# Patient Record
Sex: Male | Born: 1955 | Race: White | Hispanic: No | Marital: Single | Smoking: Former smoker
Health system: Southern US, Community
[De-identification: ages and names within clinical notes are randomized; demographics above are authoritative.]

## PROBLEM LIST (undated history)

## (undated) DIAGNOSIS — J449 Chronic obstructive pulmonary disease, unspecified: Secondary | ICD-10-CM

## (undated) HISTORY — PX: ANKLE FRACTURE SURGERY: SHX122

## (undated) HISTORY — PX: EYE SURGERY: SHX253

---

## 2001-07-20 ENCOUNTER — Encounter: Payer: Self-pay | Admitting: Internal Medicine

## 2001-07-20 ENCOUNTER — Encounter: Admission: RE | Admit: 2001-07-20 | Discharge: 2001-07-20 | Payer: Self-pay | Admitting: Internal Medicine

## 2005-02-07 ENCOUNTER — Emergency Department (HOSPITAL_COMMUNITY): Admission: EM | Admit: 2005-02-07 | Discharge: 2005-02-07 | Payer: Self-pay | Admitting: Emergency Medicine

## 2011-06-04 ENCOUNTER — Encounter (HOSPITAL_COMMUNITY): Payer: Self-pay | Admitting: Internal Medicine

## 2011-06-04 ENCOUNTER — Other Ambulatory Visit: Payer: Self-pay | Admitting: Internal Medicine

## 2011-06-04 ENCOUNTER — Inpatient Hospital Stay (HOSPITAL_COMMUNITY)
Admission: AD | Admit: 2011-06-04 | Discharge: 2011-06-12 | DRG: 190 | Disposition: A | Discharge status: Home / Self Care | Payer: Managed Care, Other (non HMO) | Source: Ambulatory Visit | Attending: Internal Medicine | Admitting: Internal Medicine

## 2011-06-04 DIAGNOSIS — I498 Other specified cardiac arrhythmias: Secondary | ICD-10-CM | POA: Diagnosis present

## 2011-06-04 DIAGNOSIS — J449 Chronic obstructive pulmonary disease, unspecified: Secondary | ICD-10-CM | POA: Diagnosis present

## 2011-06-04 DIAGNOSIS — F17200 Nicotine dependence, unspecified, uncomplicated: Secondary | ICD-10-CM | POA: Diagnosis present

## 2011-06-04 DIAGNOSIS — J9610 Chronic respiratory failure, unspecified whether with hypoxia or hypercapnia: Secondary | ICD-10-CM | POA: Diagnosis present

## 2011-06-04 DIAGNOSIS — J441 Chronic obstructive pulmonary disease with (acute) exacerbation: Secondary | ICD-10-CM

## 2011-06-04 DIAGNOSIS — R0902 Hypoxemia: Secondary | ICD-10-CM | POA: Diagnosis present

## 2011-06-04 DIAGNOSIS — Z79899 Other long term (current) drug therapy: Secondary | ICD-10-CM

## 2011-06-04 DIAGNOSIS — Z23 Encounter for immunization: Secondary | ICD-10-CM

## 2011-06-04 DIAGNOSIS — J9600 Acute respiratory failure, unspecified whether with hypoxia or hypercapnia: Secondary | ICD-10-CM | POA: Diagnosis present

## 2011-06-04 DIAGNOSIS — Z720 Tobacco use: Secondary | ICD-10-CM | POA: Diagnosis present

## 2011-06-04 DIAGNOSIS — IMO0002 Reserved for concepts with insufficient information to code with codable children: Secondary | ICD-10-CM

## 2011-06-04 HISTORY — DX: Chronic obstructive pulmonary disease, unspecified: J44.9

## 2011-06-04 LAB — DIFFERENTIAL
Basophils Absolute: 0 10*3/uL (ref 0.0–0.1)
Basophils Relative: 0 % (ref 0–1)
Eosinophils Absolute: 0 10*3/uL (ref 0.0–0.7)
Eosinophils Relative: 0 % (ref 0–5)
Lymphocytes Relative: 6 % — ABNORMAL LOW (ref 12–46)
Lymphs Abs: 0.7 10*3/uL (ref 0.7–4.0)
Monocytes Absolute: 0.6 10*3/uL (ref 0.1–1.0)
Monocytes Relative: 4 % (ref 3–12)
Neutro Abs: 11.3 10*3/uL — ABNORMAL HIGH (ref 1.7–7.7)
Neutrophils Relative %: 89 % — ABNORMAL HIGH (ref 43–77)

## 2011-06-04 LAB — CBC
HCT: 52 % (ref 39.0–52.0)
Hemoglobin: 18.6 g/dL — ABNORMAL HIGH (ref 13.0–17.0)
MCH: 33.5 pg (ref 26.0–34.0)
MCHC: 35.8 g/dL (ref 30.0–36.0)
MCV: 93.7 fL (ref 78.0–100.0)
Platelets: 183 10*3/uL (ref 150–400)
RBC: 5.55 MIL/uL (ref 4.22–5.81)
RDW: 12.1 % (ref 11.5–15.5)
WBC: 12.6 10*3/uL — ABNORMAL HIGH (ref 4.0–10.5)

## 2011-06-04 LAB — COMPREHENSIVE METABOLIC PANEL
ALT: 29 U/L (ref 0–53)
AST: 31 U/L (ref 0–37)
Albumin: 4.3 g/dL (ref 3.5–5.2)
Alkaline Phosphatase: 107 U/L (ref 39–117)
BUN: 16 mg/dL (ref 6–23)
CO2: 30 mEq/L (ref 19–32)
Calcium: 9.5 mg/dL (ref 8.4–10.5)
Chloride: 98 mEq/L (ref 96–112)
Creatinine, Ser: 0.91 mg/dL (ref 0.50–1.35)
GFR calc Af Amer: >90 mL/min (ref >90)
GFR calc non Af Amer: >90 mL/min (ref >90)
Glucose, Bld: 100 mg/dL — ABNORMAL HIGH (ref 70–99)
Potassium: 4.9 mEq/L (ref 3.5–5.1)
Sodium: 137 mEq/L (ref 135–145)
Total Bilirubin: 0.2 mg/dL — ABNORMAL LOW (ref 0.3–1.2)
Total Protein: 7.5 g/dL (ref 6.0–8.3)

## 2011-06-04 MED ORDER — ALBUTEROL SULFATE (5 MG/ML) 0.5% IN NEBU: 2.5000 mg | INHALATION_SOLUTION | RESPIRATORY_TRACT | Status: DC | PRN

## 2011-06-04 MED ORDER — SODIUM CHLORIDE 0.9 % IJ SOLN
3.0000 mL | Freq: Two times a day (BID) | INTRAMUSCULAR | Status: DC
  Administered 2011-06-04 – 2011-06-05 (×3): 3 mL via INTRAVENOUS
  Administered 2011-06-06: 10:00:00 via INTRAVENOUS
  Administered 2011-06-06 – 2011-06-07 (×3): 3 mL via INTRAVENOUS
  Administered 2011-06-08: 10:00:00 via INTRAVENOUS
  Administered 2011-06-08 – 2011-06-12 (×8): 3 mL via INTRAVENOUS

## 2011-06-04 MED ORDER — SODIUM CHLORIDE 0.9 % IV SOLN: 250.0000 mL | INTRAVENOUS | Status: DC | PRN

## 2011-06-04 MED ORDER — ALBUTEROL SULFATE (5 MG/ML) 0.5% IN NEBU: 2.5000 mg | INHALATION_SOLUTION | Freq: Four times a day (QID) | RESPIRATORY_TRACT | Status: DC

## 2011-06-04 MED ORDER — PROMETHAZINE HCL 12.5 MG PO TABS: 12.5000 mg | ORAL_TABLET | Freq: Four times a day (QID) | ORAL | Status: DC | PRN

## 2011-06-04 MED ORDER — METHYLPREDNISOLONE SODIUM SUCC 125 MG IJ SOLR
60.0000 mg | Freq: Two times a day (BID) | INTRAMUSCULAR | Status: DC
  Administered 2011-06-04 – 2011-06-05 (×2): 60 mg via INTRAVENOUS
  Filled 2011-06-04: qty 2
  Filled 2011-06-04 (×2): qty 0.96
  Filled 2011-06-04: qty 2

## 2011-06-04 MED ORDER — IPRATROPIUM BROMIDE 0.02 % IN SOLN
RESPIRATORY_TRACT | Status: AC
  Administered 2011-06-04: 0.5 mg via RESPIRATORY_TRACT
  Filled 2011-06-04: qty 2.5

## 2011-06-04 MED ORDER — ALBUTEROL SULFATE (5 MG/ML) 0.5% IN NEBU
2.5000 mg | INHALATION_SOLUTION | Freq: Once | RESPIRATORY_TRACT | Status: AC
  Administered 2011-06-04: 2.5 mg via RESPIRATORY_TRACT

## 2011-06-04 MED ORDER — ALBUTEROL SULFATE (5 MG/ML) 0.5% IN NEBU
INHALATION_SOLUTION | RESPIRATORY_TRACT | Status: AC
  Administered 2011-06-04: 2.5 mg via RESPIRATORY_TRACT
  Filled 2011-06-04: qty 0.5

## 2011-06-04 MED ORDER — HEPARIN SODIUM (PORCINE) 5000 UNIT/ML IJ SOLN
5000.0000 [IU] | Freq: Three times a day (TID) | INTRAMUSCULAR | Status: DC
  Administered 2011-06-04 – 2011-06-12 (×23): 5000 [IU] via SUBCUTANEOUS
  Filled 2011-06-04 (×27): qty 1

## 2011-06-04 MED ORDER — IPRATROPIUM BROMIDE 0.02 % IN SOLN
0.5000 mg | Freq: Once | RESPIRATORY_TRACT | Status: AC
  Administered 2011-06-04: 0.5 mg via RESPIRATORY_TRACT

## 2011-06-04 MED ORDER — LEVOFLOXACIN 500 MG PO TABS
500.0000 mg | ORAL_TABLET | Freq: Every day | ORAL | Status: AC
  Administered 2011-06-04 – 2011-06-10 (×7): 500 mg via ORAL
  Filled 2011-06-04 (×8): qty 1

## 2011-06-04 MED ORDER — FLUTICASONE-SALMETEROL 250-50 MCG/DOSE IN AEPB
1.0000 | INHALATION_SPRAY | Freq: Two times a day (BID) | RESPIRATORY_TRACT | Status: DC
  Administered 2011-06-04 – 2011-06-06 (×4): 1 via RESPIRATORY_TRACT
  Filled 2011-06-04: qty 14

## 2011-06-04 MED ORDER — PROMETHAZINE HCL 25 MG/ML IJ SOLN
12.5000 mg | Freq: Four times a day (QID) | INTRAMUSCULAR | Status: DC | PRN
  Filled 2011-06-04: qty 1

## 2011-06-04 MED ORDER — ALBUTEROL SULFATE (5 MG/ML) 0.5% IN NEBU
2.5000 mg | INHALATION_SOLUTION | RESPIRATORY_TRACT | Status: DC
  Administered 2011-06-04 – 2011-06-05 (×3): 2.5 mg via RESPIRATORY_TRACT
  Filled 2011-06-04 (×3): qty 0.5

## 2011-06-04 MED ORDER — ACETAMINOPHEN 325 MG PO TABS
650.0000 mg | ORAL_TABLET | Freq: Four times a day (QID) | ORAL | Status: DC | PRN
  Administered 2011-06-04: 650 mg via ORAL
  Filled 2011-06-04 (×2): qty 2

## 2011-06-04 MED ORDER — SODIUM CHLORIDE 0.9 % IJ SOLN
3.0000 mL | INTRAMUSCULAR | Status: DC | PRN
  Administered 2011-06-05 (×2): 3 mL via INTRAVENOUS

## 2011-06-04 MED ORDER — ACETAMINOPHEN 650 MG RE SUPP: 650.0000 mg | Freq: Four times a day (QID) | RECTAL | Status: DC | PRN

## 2011-06-04 NOTE — Progress Notes (Signed)
Resiratory in to see pt , order obtained from Dr. Earl Gala for breating tx 7 MD made aware of pt' problem breathing.  Instructed nurse will come in to see pt.  Alao mentioned Pt's HR 130 140.  Will cont. To monitor.

## 2011-06-04 NOTE — Progress Notes (Signed)
Pt direct admit from MD's office with SOB.  Pt with COPD DX.   Alert  & up with assist from w/c.  Noted with purse lip breathing.  O2 sat 91% on 2L [place] T98.3 P136 BP165/94 R28, unable to fully communicate.  Rapid Response nurse notified & instructed nurse to call RT till she get up to see pt .  RT paged.  Pt lungs diminished in all bases, & BLU with some expiratory wheezing..   Will cont. To monitor.

## 2011-06-04 NOTE — H&P (Signed)
  Patient admitted for COP Dexacerbation. Full note to follow.

## 2011-06-04 NOTE — H&P (Signed)
Hospital Admission Note Date: 06/04/2011  Patient name: [name] Medical record number: [id number] Date of birth: Mar 21, 1956 Age: 56 y.o. Gender: male PCP: No primary provider on file.  Attending physician: Darnelle Bos, MD  Chief Complaint: Shortness of breath  History of Present Illness: [name] is an 56 y.o. male who presents with shortness of breath. He has had problems with COPD with mild exacerbations from time to time. He is chronically on Advair. He continues to smoke, unfortunately, despite our encouragement otherwise. He developed increasing dyspnea about two days ago. It worsened. He was seen in office, had O2 sat in the mid 70s and was given a breathing treatment. He was sent to 4700 for admit.   Once at 4700, he was very dyspneic. Rapid response was called and albuterol and oxygen were given. He is feeling somewhat better.  Past Medical History  Diagnosis Date  . COPD (chronic obstructive pulmonary disease)    Meds: Prescriptions prior to admission  Medication Sig Dispense Refill  . Fluticasone-Salmeterol (ADVAIR) 250-50 MCG/DOSE AEPB Inhale 1 puff into the lungs every 12 (twelve) hours.       Allergies: Review of patient's allergies indicates not on file. History   Social History  . Marital Status: Single    Spouse Name: N/A    Number of Children: N/A  . Years of Education: N/A   Occupational History  . Not on file.   Social History Main Topics  . Smoking status: Current Everyday Smoker -- 1.0 packs/day for 30 years    Types: Cigarettes  . Smokeless tobacco: Not on file  . Alcohol Use:   . Drug Use: No  . Sexually Active:    Other Topics Concern  . Not on file   Social History Narrative  . No narrative on file   History reviewed. No pertinent family history. History reviewed. No surgical history.  Review of Systems: Constitutional: negative for chills and fevers Ears, nose, mouth, throat, and face: negative for nasal congestion  and sore throat Gastrointestinal: negative Neurological: negative Physical Exam: Blood pressure 165/94, pulse 139, temperature 98.3 F (36.8 C), temperature source Oral, resp. rate 21, height 5\' 3"  (1.6 m), SpO2 94.00%. General appearance: alert and appears stated age Eyes: conjunctivae/corneas clear. PERRL, EOM's intact. Fundi benign. Ears: normal TM's and external ear canals both ears Nose: Nares normal. Septum midline. Mucosa normal. No drainage or sinus tenderness. Throat: lips, mucosa, and tongue normal; teeth and gums normal Neck: no adenopathy, no carotid bruit, no JVD, supple, symmetrical, trachea midline and thyroid not enlarged, symmetric, no tenderness/mass/nodules Lungs: decreased breath sounds; expiratory wheezes on right Heart: regular rate and rhythm, S1, S2 normal, no murmur, click, rub or gallop Abdomen: soft, non-tender; bowel sounds normal; no masses,  no organomegaly Neurologic: Alert and oriented X 3, normal strength and tone. Normal symmetric reflexes. Normal coordination and gait Lab results: Pending  Imaging results:  CXR in office: no infiltrate; hyperinflation  Assessment & Plan: Active Problems:  COPD exacerbation - this is a severe exacerbation. He is admitted for abx, solumedrol, nebs, oxygen and close observation   [facility] 06/04/2011, 6:12 PM

## 2011-06-04 NOTE — Progress Notes (Signed)
Called to assist with patient direct admit from MD office with SOB - RT Tammy Sours and Dorene Grebe sent to assist - this RN tied up in another emergency.  Patient sitting on edge of bed with RR 30's able to speak few words using accessory muscles - Albuterol and Atrovent nebulizer given per RRT protocol.  Patient with decreased WOB and able to speak full sentences post treatment.  On my arrival patient with purse lip breathing - he states he does this at home as needed - speaking full sentences - RR 26 -on 3 liter n/c O2 sats s93%.  Trachea midline.  Bil BS present decreased in bases some exp wheezing noted right side.  HR 130's.  Dr. Earl Gala at bedside.  #20 angiocath inserted left hand times one attempt - site ok - flushes well.  RN Toni Amend at bedside - to start IV fluids and give IV meds per order.  Cont O2 sat monitor ordered.  Labs drawn.  Patient comfortable.  Will follow.  Temp 98.3 orally - 165/94.

## 2011-06-05 ENCOUNTER — Inpatient Hospital Stay (HOSPITAL_COMMUNITY): Payer: Managed Care, Other (non HMO)

## 2011-06-05 ENCOUNTER — Other Ambulatory Visit: Payer: Self-pay

## 2011-06-05 ENCOUNTER — Encounter (HOSPITAL_COMMUNITY): Payer: Self-pay | Admitting: Internal Medicine

## 2011-06-05 DIAGNOSIS — R000 Tachycardia, unspecified: Secondary | ICD-10-CM

## 2011-06-05 DIAGNOSIS — J449 Chronic obstructive pulmonary disease, unspecified: Secondary | ICD-10-CM

## 2011-06-05 DIAGNOSIS — J9600 Acute respiratory failure, unspecified whether with hypoxia or hypercapnia: Secondary | ICD-10-CM

## 2011-06-05 DIAGNOSIS — Z720 Tobacco use: Secondary | ICD-10-CM | POA: Diagnosis present

## 2011-06-05 DIAGNOSIS — J9610 Chronic respiratory failure, unspecified whether with hypoxia or hypercapnia: Secondary | ICD-10-CM | POA: Diagnosis present

## 2011-06-05 DIAGNOSIS — J4489 Other specified chronic obstructive pulmonary disease: Secondary | ICD-10-CM

## 2011-06-05 LAB — BLOOD GAS, ARTERIAL
Acid-Base Excess: 0.9 mmol/L (ref 0.0–2.0)
Drawn by: 31101
FIO2: 100 %
pCO2 arterial: 67.4 mmHg (ref 35.0–45.0)
pO2, Arterial: 279 mmHg — ABNORMAL HIGH (ref 80.0–100.0)

## 2011-06-05 LAB — MRSA PCR SCREENING: MRSA by PCR: NEGATIVE

## 2011-06-05 LAB — EXPECTORATED SPUTUM ASSESSMENT W REFEX TO RESP CULTURE

## 2011-06-05 LAB — EXPECTORATED SPUTUM ASSESSMENT W GRAM STAIN, RFLX TO RESP C

## 2011-06-05 MED ORDER — LEVALBUTEROL TARTRATE 45 MCG/ACT IN AERO
6.0000 | INHALATION_SPRAY | RESPIRATORY_TRACT | Status: DC | PRN
  Filled 2011-06-05: qty 15

## 2011-06-05 MED ORDER — NICOTINE 14 MG/24HR TD PT24
14.0000 mg | MEDICATED_PATCH | Freq: Every day | TRANSDERMAL | Status: DC
  Administered 2011-06-05 – 2011-06-12 (×8): 14 mg via TRANSDERMAL
  Filled 2011-06-05 (×10): qty 1

## 2011-06-05 MED ORDER — METHYLPREDNISOLONE SODIUM SUCC 125 MG IJ SOLR
60.0000 mg | Freq: Four times a day (QID) | INTRAMUSCULAR | Status: DC
  Administered 2011-06-05 (×3): 60 mg via INTRAVENOUS
  Administered 2011-06-06: 17:00:00 via INTRAVENOUS
  Administered 2011-06-06: 60 mg via INTRAVENOUS
  Administered 2011-06-06: 12:00:00 via INTRAVENOUS
  Administered 2011-06-07 (×2): 60 mg via INTRAVENOUS
  Filled 2011-06-05 (×3): qty 0.96
  Filled 2011-06-05: qty 2
  Filled 2011-06-05 (×5): qty 0.96
  Filled 2011-06-05: qty 2
  Filled 2011-06-05 (×2): qty 0.96

## 2011-06-05 MED ORDER — INFLUENZA VIRUS VACC SPLIT PF IM SUSP
0.5000 mL | INTRAMUSCULAR | Status: AC
  Administered 2011-06-06: 0.5 mL via INTRAMUSCULAR
  Filled 2011-06-05: qty 0.5

## 2011-06-05 MED ORDER — LEVALBUTEROL HCL 0.63 MG/3ML IN NEBU
0.6300 mg | INHALATION_SOLUTION | RESPIRATORY_TRACT | Status: DC
  Administered 2011-06-05 – 2011-06-06 (×6): 0.63 mg via RESPIRATORY_TRACT
  Filled 2011-06-05 (×13): qty 3

## 2011-06-05 MED ORDER — PNEUMOCOCCAL VAC POLYVALENT 25 MCG/0.5ML IJ INJ
0.5000 mL | INJECTION | INTRAMUSCULAR | Status: AC
  Filled 2011-06-05: qty 0.5

## 2011-06-05 NOTE — Progress Notes (Signed)
Per pt. Request pt. Did not want his father called at this time to notify him of transfer off unit.

## 2011-06-05 NOTE — Progress Notes (Signed)
0430 Respiratory was in pt. Room to give pt. Breathing treatment. Pt. Began to become very anxious his heart rate was sustaining in the 140's, pt. Having labored breathing and hunched over bedside table. Respiration 30, BP 170/90, HR 140, SAT 93% on venteri mask at 40%. Rapid was notified and MD notified. Pt. States he is feeling worse and having significant difficulty with breathing.  vitals at 0445 resp 30, HR 127, BP 165/103, SAT 100% on non-rebreather. Pt. Doing pursed lipped breathing, ABG drawn, respiratory, rapid and HC at the bedside. ABG resulted at 0500 ph 7.23, CO2 67.4, PO2 279, Bicarb 27.6. Pt. Having severe labored breathing MD notified spoke with rapid at bedside order for pt. To be transferred to ICU 2900 more orders to follow. Vitals at this time HR 143, Resp 32, O2 99% non-rebreather, pt. Alert and oriented , but hunched over in order to make breathing easier. Report given to [facility] on 2900. Pt. Transferred off the floor at 0515 by rapid and charge nurse while report was being given.

## 2011-06-05 NOTE — Progress Notes (Signed)
Patient well known to me, but rarely comes to office.   Events of the night noted. Appreciate help of Rapid Response Team and Dr. Kendrick Fries.   He had a CXR in our office yesterday that showed no infiltrate. That has been repeated this morning.   I note his acute hypercarbia on ABG.   His Hb is 18 which may be partial volume depletion but I suspect it indicates chronic hypoxemia.   I think CCM should be attending now. I will assume care when he is out of danger/unit

## 2011-06-05 NOTE — Progress Notes (Signed)
It was attempted to complete admission history with pt. Pt. Refused at this time and requested it be done in the morning. Admission nurse is aware and he is on the list for the morning.

## 2011-06-05 NOTE — Consult Note (Signed)
Name: [name] MRN: [id number] DOB: 12/05/1955  LOS: 1  PCCM CONSULT NOTE  History of Present Illness: 56 y/o male with COPD who was admitted on 06/04/11 to Dr. Newell Coral service for a presumed COPD exacerbation.  He had noted two days of increasing dyspnea.  He had a history of prior mild COPD exacerbations.  He was seen in Dr. Newell Coral office and was found to have an O2 saturation in the 70's so he was admitted.  Since he was admitted around 1800 on 06/04/11 the Rapid Response Team was called twice for worsening dyspnea.  He responded to nebulizer treatments the first time and continued to receive nebulizer treatments throughout the night.  However by 0500 his shortness of breath worsened significantly so he was transferred to the 2900 unit for further management and BIPAP.  On arrival his respiratory rate was 35-40 times per minute on BIPAP, but he as managed to slow his respirations down after about 45 minutes of BIPAP.  Lines / Drains: 06/05/11 BIPAP (started 0530)>>  Cultures / Sepsis markers: 06/05/11 sputum >>  Antibiotics: 06/04/11 Levaquin >>  Tests / Events: 06/05/11 transferred to 2900 for BIPAP     Past Medical History  Diagnosis Date  . COPD (chronic obstructive pulmonary disease)    History reviewed. No pertinent past surgical history. Prior to Admission medications   Medication Sig Start Date End Date Taking? Authorizing Provider  Fluticasone-Salmeterol (ADVAIR) 250-50 MCG/DOSE AEPB Inhale 1 puff into the lungs every 12 (twelve) hours.   Yes Historical Provider, MD   Allergies not on file History reviewed. No pertinent family history. Social History  reports that he has been smoking Cigarettes.  He has a 30 pack-year smoking history. He does not have any smokeless tobacco history on file. He reports that he does not use illicit drugs. His alcohol history not on file.  Review Of Systems   Cannot obtain due to BIPAP, respiratory distress  Vital Signs:   Filed  Vitals:   06/05/11 0525 06/05/11 0530 06/05/11 0608 06/05/11 0609  BP: 193/88 175/91    Pulse: 133     Temp:      TempSrc:      Resp: 34 33  15  Height:      Weight:      SpO2: 95% 97% 99% 99%     Physical Examination: Gen: acute respiratory distress HEENT: NCAT, PERRL, EOMi, OP clear, BIPAP mask in place Neck: supple without masses PULM: Poor air movement, some wheezing noted but faint CV: tachy, regular, no mgr, no JVD AB: BS+, soft, nontender, no hsm Ext: warm, no edema, no clubbing, no cyanosis Derm: no rash or skin breakdown Neuro: A&Ox4, CN II-XII intact, strength 5/5 in all 4 extremities Psyche: anxious  Labs and Imaging:    EKG: Sinus tach, RAE  CBC    Component Value Date/Time   WBC 12.6* 06/04/2011 1808   RBC 5.55 06/04/2011 1808   HGB 18.6* 06/04/2011 1808   HCT 52.0 06/04/2011 1808   PLT 183 06/04/2011 1808   MCV 93.7 06/04/2011 1808   MCH 33.5 06/04/2011 1808   MCHC 35.8 06/04/2011 1808   RDW 12.1 06/04/2011 1808   LYMPHSABS 0.7 06/04/2011 1808   MONOABS 0.6 06/04/2011 1808   EOSABS 0.0 06/04/2011 1808   BASOSABS 0.0 06/04/2011 1808    BMET    Component Value Date/Time   NA 137 06/04/2011 1808   K 4.9 06/04/2011 1808   CL 98 06/04/2011 1808   CO2 30  06/04/2011 1808   GLUCOSE 100* 06/04/2011 1808   BUN 16 06/04/2011 1808   CREATININE 0.91 06/04/2011 1808   CALCIUM 9.5 06/04/2011 1808   GFRNONAA >90 06/04/2011 1808   GFRAA >90 06/04/2011 1808     Assessment and Plan:  56 y/o male with COPD on Advair who continues to smoke who was admitted for a severe COPD exacerbation.  He has acute respiratory failure but appears to be improving with BIPAP.  Needs a CXR to ensure no other pulmonary pathology (pneumonia, edema, etc).    COPD exacerbation (06/04/2011)   Assessment: Most likely explanation, needs CXR to ensure no infiltrate or edema (which seem unlikely)   Plan:  -I have increased the frequency of solumedrol to q6h -change albuterol to xopenex due to heart  rate -continue levaquin -check sputum culture -continue BIPAP for now, could likely stop later in the morning if he continues to improve -CXR now  Respiratory failure (06/05/2011)   Assessment: due to a severe COPD exacerbation   Plan:  -BIPAP -Solumedrol, nebs -as above  Tobacco Abuse Plan: -counsel to quit  Sinus Tachycardia: Assessment: due to albuterol and rep distress, improving Plan: -change to xopenex  Full code per discussion with patient  Best practices / Disposition: ICU status on PCCM service  Feeding/protein malnutrition: npo Analgesia: n/a Sedation: n/a Thromboprophylaxis: sub p hep HOB >30 degrees Ulcer prophylaxis: n/a Glucose control/hyperglycemia: monitor bmet  The patient is critically ill with multiple organ systems failure and requires high complexity decision making for assessment and support, frequent evaluation and titration of therapies, application of advanced monitoring technologies and extensive interpretation of multiple databases. Critical Care Time devoted to patient care services described in this note is 45 minutes.  Heber Lakeland, M.D. Pulmonary and Critical Care Medicine [facility] Pager: [phone]  06/05/2011, 6:16 AM

## 2011-06-06 DIAGNOSIS — R000 Tachycardia, unspecified: Secondary | ICD-10-CM

## 2011-06-06 DIAGNOSIS — J449 Chronic obstructive pulmonary disease, unspecified: Secondary | ICD-10-CM

## 2011-06-06 DIAGNOSIS — J9600 Acute respiratory failure, unspecified whether with hypoxia or hypercapnia: Secondary | ICD-10-CM

## 2011-06-06 LAB — BASIC METABOLIC PANEL
BUN: 28 mg/dL — ABNORMAL HIGH (ref 6–23)
Calcium: 9.1 mg/dL (ref 8.4–10.5)
Creatinine, Ser: 0.78 mg/dL (ref 0.50–1.35)
GFR calc Af Amer: >90 mL/min (ref >90)
GFR calc non Af Amer: >90 mL/min (ref >90)
Potassium: 4.6 mEq/L (ref 3.5–5.1)

## 2011-06-06 LAB — CBC
HCT: 46.8 % (ref 39.0–52.0)
MCHC: 34 g/dL (ref 30.0–36.0)
MCV: 94.2 fL (ref 78.0–100.0)
RDW: 12.2 % (ref 11.5–15.5)

## 2011-06-06 MED ORDER — IPRATROPIUM BROMIDE 0.02 % IN SOLN
0.5000 mg | Freq: Four times a day (QID) | RESPIRATORY_TRACT | Status: DC
  Administered 2011-06-06 – 2011-06-08 (×8): 0.5 mg via RESPIRATORY_TRACT
  Filled 2011-06-06 (×8): qty 2.5

## 2011-06-06 MED ORDER — LEVALBUTEROL HCL 0.63 MG/3ML IN NEBU
0.6300 mg | INHALATION_SOLUTION | Freq: Four times a day (QID) | RESPIRATORY_TRACT | Status: DC
  Administered 2011-06-06 – 2011-06-11 (×18): 0.63 mg via RESPIRATORY_TRACT
  Filled 2011-06-06 (×24): qty 3

## 2011-06-06 MED ORDER — LEVALBUTEROL HCL 0.63 MG/3ML IN NEBU
0.6300 mg | INHALATION_SOLUTION | RESPIRATORY_TRACT | Status: DC | PRN
  Filled 2011-06-06: qty 3

## 2011-06-06 NOTE — Progress Notes (Signed)
Appreciate care of my long time friend and patient.   I will be out of town 1/18, so no one from my office will be by to see him tomorrow. When patient is stable for transfer back to IM service, please call doc on call at [phone]. I will alert my colleagues that he will (hopefully) be stable enough to do that.   Thanks again.

## 2011-06-06 NOTE — Progress Notes (Signed)
Name: [name] MRN: [id number] DOB: 1955/06/06  LOS: 2  PCCM PROGRESS NOTE  History of Present Illness: 56 y/o male with COPD who was admitted on 06/04/11 to Dr. Newell Coral service for a presumed COPD exacerbation.  He had noted two days of increasing dyspnea.  He had a history of prior mild COPD exacerbations.  He was seen in Dr. Newell Coral office and was found to have an O2 saturation in the 70's so he was admitted.  Since he was admitted around 1800 on 06/04/11 the Rapid Response Team was called twice for worsening dyspnea.  He responded to nebulizer treatments the first time and continued to receive nebulizer treatments throughout the night.  However by 0500 his shortness of breath worsened significantly so he was transferred to the 2900 unit for further management and BIPAP.  On arrival his respiratory rate was 35-40 times per minute on BIPAP, but he as managed to slow his respirations down after about 45 minutes of BIPAP.  Subjective/Overnight Events: - Off BIPAP for total of 2-3 hours, approx 2 hours in a row, however work of breathing increased, and patient requested to restarted on BIPAP - Still with significant respiratory distress with any activity, such as using bedside commode  Lines / Drains: 06/05/11 BIPAP (started 0530)>>  Cultures / Sepsis markers: 06/05/11 sputum >>  Antibiotics: 06/04/11 Levaquin >>  Tests / Events: 06/05/11 transferred to 2900 for BIPAP     Vital Signs:   Temp:  [97.1 F (36.2 C)-98.7 F (37.1 C)] 97.5 F (36.4 C) (01/17 0739) Pulse Rate:  [99-118] 99  (01/17 0739) Resp:  [14-27] 25  (01/17 0739) BP: (94-135)/(68-87) 122/72 mmHg (01/17 0739) SpO2:  [92 %-99 %] 96 % (01/17 0739) FiO2 (%):  [30 %] 30 % (01/17 0411) Weight:  [106 lb 11.2 oz (48.4 kg)] 106 lb 11.2 oz (48.4 kg) (01/16 0800)   Physical Examination: Gen: mild respiratory distress, on BIPAP HEENT: NCAT, PERRL, EOMi, BIPAP mask in place PULM: Poor air movement, some wheezing noted  but faint, coarse breath sounds CV: tachy, regular, no mgr, no JVD AB: BS+, soft, nontender, no hsm Ext: warm, no edema, no clubbing, no cyanosis Derm: no rash or skin breakdown Neuro: A&Ox4, otherwise nonfocal  Labs and Imaging:    CBC    Component Value Date/Time   WBC 10.9* 06/06/2011 0555   RBC 4.97 06/06/2011 0555   HGB 15.9 06/06/2011 0555   HCT 46.8 06/06/2011 0555   PLT 161 06/06/2011 0555   MCV 94.2 06/06/2011 0555   MCH 32.0 06/06/2011 0555   MCHC 34.0 06/06/2011 0555   RDW 12.2 06/06/2011 0555   LYMPHSABS 0.7 06/04/2011 1808   MONOABS 0.6 06/04/2011 1808   EOSABS 0.0 06/04/2011 1808   BASOSABS 0.0 06/04/2011 1808   BMET    Component Value Date/Time   NA 133* 06/06/2011 0555   K 4.6 06/06/2011 0555   CL 95* 06/06/2011 0555   CO2 29 06/06/2011 0555   GLUCOSE 137* 06/06/2011 0555   BUN 28* 06/06/2011 0555   CREATININE 0.78 06/06/2011 0555   CALCIUM 9.1 06/06/2011 0555   GFRNONAA >90 06/06/2011 0555   GFRAA >90 06/06/2011 0555     Assessment and Plan: 56 y/o male with COPD on Advair who continues to smoke who was admitted for a severe COPD exacerbation.  He has acute respiratory failure but appears to be improving with BIPAP.    COPD exacerbation    Assessment: Precipitating factor unknown, but likely URI (viral). No  consolidations/PNA noted on CXR 1/16, and patient has been afebrile with leukocytosis improving.    Plan:  -Solumedrol 125 mg Q6h started 1/16, patient with some improvement, thus will decrease dose to 60mg  q6h -Continue xopenex due to heart rate -Continue levaquin, today is day 3 of 7 -Follow up sputum culture -Continue BIPAP for now, with hopes to extend time off as he improves   Respiratory failure  Assessment: due to a severe COPD exacerbation  Plan:  -BIPAP with breaks to venti mask -Solumedrol, nebs -as above  Tobacco Abuse  Plan: -counsel to quit  Sinus Tachycardia:  Assessment: due to albuterol and rep distress,  improving  Plan: -Continue xopenex   Full code per discussion with patient  Best practices / Disposition: ICU status on PCCM service  Feeding/protein malnutrition: regular Analgesia: n/a Sedation: n/a Thromboprophylaxis: sub p hep HOB >30 degrees Ulcer prophylaxis: n/a Glucose control/hyperglycemia: monitor bmet   [name], PGY-3 06/06/2011, 7:48 AM  Levy Pupa, MD, PhD 06/06/2011, 9:09 AM [place] [facility] [phone] or if no answer [phone]

## 2011-06-07 LAB — BASIC METABOLIC PANEL
Calcium: 9.5 mg/dL (ref 8.4–10.5)
Creatinine, Ser: 0.8 mg/dL (ref 0.50–1.35)
GFR calc Af Amer: >90 mL/min (ref >90)

## 2011-06-07 LAB — CBC
Platelets: 181 10*3/uL (ref 150–400)
RDW: 12.3 % (ref 11.5–15.5)
WBC: 11.1 10*3/uL — ABNORMAL HIGH (ref 4.0–10.5)

## 2011-06-07 MED ORDER — PNEUMOCOCCAL VAC POLYVALENT 25 MCG/0.5ML IJ INJ
0.5000 mL | INJECTION | Freq: Once | INTRAMUSCULAR | Status: AC
  Administered 2011-06-07: 0.5 mL via INTRAMUSCULAR
  Filled 2011-06-07: qty 0.5

## 2011-06-07 MED ORDER — FLUTICASONE-SALMETEROL 250-50 MCG/DOSE IN AEPB
1.0000 | INHALATION_SPRAY | Freq: Two times a day (BID) | RESPIRATORY_TRACT | Status: DC
  Administered 2011-06-08 – 2011-06-12 (×9): 1 via RESPIRATORY_TRACT
  Filled 2011-06-07: qty 14

## 2011-06-07 MED ORDER — METHYLPREDNISOLONE SODIUM SUCC 125 MG IJ SOLR
60.0000 mg | Freq: Three times a day (TID) | INTRAMUSCULAR | Status: DC
  Administered 2011-06-07: 15:00:00 via INTRAVENOUS
  Administered 2011-06-07 – 2011-06-08 (×2): 60 mg via INTRAVENOUS
  Filled 2011-06-07 (×3): qty 0.96

## 2011-06-07 NOTE — Progress Notes (Signed)
Name: [name] MRN: [id number] DOB: 12/25/1955  LOS: 3  PCCM PROGRESS NOTE  History of Present Illness: 56 y/o male with COPD who was admitted on 06/04/11 to Dr. Newell Coral service for a presumed COPD exacerbation. He was seen in Dr. Newell Coral office and was found to have an O2 saturation in the 70's so he was admitted. His shortness of breath worsened significantly so he was transferred to the 2900 unit for further management and BIPAP.    Subjective/Overnight Events: - Used Bipap overnight - Still with significant respiratory distress with any activity, such as using bedside commode  Lines / Drains: 06/05/11 BIPAP (started 0530)>>  Cultures / Sepsis markers: 06/05/11 sputum >>oral spec  Antibiotics: 06/04/11 Levaquin >>  Tests / Events: 06/05/11 transferred to 2900 for BIPAP     Vital Signs:   Temp:  [97.4 F (36.3 C)-98.4 F (36.9 C)] 97.4 F (36.3 C) (01/18 0407) Pulse Rate:  [71-104] 72  (01/18 0801) Resp:  [13-35] 16  (01/18 0801) BP: (102-140)/(66-93) 117/77 mmHg (01/18 0801) SpO2:  [92 %-98 %] 98 % (01/18 0801) FiO2 (%):  [30 %] 30 % (01/18 0801)   Physical Examination: Gen: mild respiratory distress, on BIPAP HEENT: NCAT, PERRL, EOMi, BIPAP mask in place PULM: Poor air movement, some wheezing noted but faint, coarse breath sounds CV: tachy, regular, no mgr, no JVD AB: BS+, soft, nontender, no hsm Ext: warm, no edema, no clubbing, no cyanosis Derm: no rash or skin breakdown Neuro: A&Ox4, otherwise nonfocal  Labs and Imaging:    CBC    Component Value Date/Time   WBC 11.1* 06/07/2011 0600   RBC 5.22 06/07/2011 0600   HGB 16.8 06/07/2011 0600   HCT 49.6 06/07/2011 0600   PLT 181 06/07/2011 0600   MCV 95.0 06/07/2011 0600   MCH 32.2 06/07/2011 0600   MCHC 33.9 06/07/2011 0600   RDW 12.3 06/07/2011 0600   LYMPHSABS 0.7 06/04/2011 1808   MONOABS 0.6 06/04/2011 1808   EOSABS 0.0 06/04/2011 1808   BASOSABS 0.0 06/04/2011 1808   BMET    Component Value Date/Time    NA 136 06/07/2011 0600   K 4.8 06/07/2011 0600   CL 96 06/07/2011 0600   CO2 31 06/07/2011 0600   GLUCOSE 137* 06/07/2011 0600   BUN 29* 06/07/2011 0600   CREATININE 0.80 06/07/2011 0600   CALCIUM 9.5 06/07/2011 0600   GFRNONAA >90 06/07/2011 0600   GFRAA >90 06/07/2011 0600     Assessment and Plan: 56 y/o male with COPD on Advair who continues to smoke who was admitted for a severe COPD exacerbation.  He has acute respiratory failure but appears to be improving with BIPAP.    COPD exacerbation    Assessment: Precipitating factor unknown, but likely URI (viral). No consolidations/PNA noted on CXR 1/16, and patient has been afebrile with leukocytosis improving.    Plan:  -Solumedrol 125 mg Q6h started 1/16, patient with some improvement,  decrease dose to 60mg  q8h -resume advair -Continue xopenex due to heart rate -Continue levaquin, today is day 4 of 7 -Try to stay off BIPAP  -SMOKING CESSATION EMPHASISED -Will need assessment for home O2 on dc   Respiratory failure  Assessment: due to a severe COPD exacerbation  Plan:  Resolving, OK to transfer to SDu, then to floor once off bipap x 24h Tobacco Abuse  Plan: -counsel to quit  Sinus Tachycardia:  Assessment: due to albuterol and rep distress, improving  Plan: -Continue xopenex   Full code per  discussion with patient  Best practices / Disposition: ICU status on PCCM service  Feeding/protein malnutrition: regular Analgesia: n/a Sedation: n/a Thromboprophylaxis: sub p hep HOB >30 degrees Ulcer prophylaxis: n/a Glucose control/hyperglycemia: monitor bmet  -Stay on PCCM service over weekend - Dr Earl Gala to resume care Monday  [name], PGY-3 06/07/2011, 8:04 AM  Cyril Mourning MD. FCCP. [place] [facility] Pager [phone] If no response call [phone]

## 2011-06-08 MED ORDER — METHYLPREDNISOLONE SODIUM SUCC 40 MG IJ SOLR
40.0000 mg | Freq: Three times a day (TID) | INTRAMUSCULAR | Status: DC
  Administered 2011-06-08 – 2011-06-10 (×6): 40 mg via INTRAVENOUS
  Filled 2011-06-08 (×9): qty 1

## 2011-06-08 MED ORDER — CHLORHEXIDINE GLUCONATE 0.12 % MT SOLN
15.0000 mL | Freq: Two times a day (BID) | OROMUCOSAL | Status: DC
  Administered 2011-06-09 – 2011-06-12 (×7): 15 mL via OROMUCOSAL
  Filled 2011-06-08 (×11): qty 15

## 2011-06-08 MED ORDER — TIOTROPIUM BROMIDE MONOHYDRATE 18 MCG IN CAPS
18.0000 ug | ORAL_CAPSULE | Freq: Every day | RESPIRATORY_TRACT | Status: DC
  Administered 2011-06-09 – 2011-06-12 (×4): 18 ug via RESPIRATORY_TRACT
  Filled 2011-06-08: qty 5

## 2011-06-08 MED ORDER — BIOTENE DRY MOUTH MT LIQD
15.0000 mL | Freq: Two times a day (BID) | OROMUCOSAL | Status: DC
  Administered 2011-06-08 – 2011-06-12 (×6): 15 mL via OROMUCOSAL

## 2011-06-08 NOTE — Progress Notes (Signed)
Name: DRAYSON DORKO MRN: [id number] DOB: 02/23/1956  LOS: 4  PCCM PROGRESS NOTE  History of Present Illness:  56 yo male smoker admitted on 06/04/2011 by Dr. Normajean Glasgow with hypoxemia, dyspnea, and AECOPD.  Transferred to ICU on 1/16 with increased dyspnea, needing BPAP, and PCCM consulted.   Subjective/Overnight Events: Did not need BPAP overnight.  Feels breathing better.  Maintaining oxygen sat's with venturi mask.  Lines / Drains: 1/16 BIPAP (started 0530)  Cultures / Sepsis markers: 1/16 sputum >>negative  Antibiotics: 1/15 Levaquin >>  Tests / Events: 1/16transferred to 2900 for BIPAP     Vital Signs:   Temp:  [96.7 F (35.9 C)-97.7 F (36.5 C)] 97.4 F (36.3 C) (01/19 0808) Pulse Rate:  [102-103] 102  (01/18 1600) Resp:  [14-28] 21  (01/19 0800) BP: (82-147)/(60-94) 128/82 mmHg (01/19 0800) SpO2:  [93 %-97 %] 97 % (01/19 0816) FiO2 (%):  [30 %] 30 % (01/19 0816)   Physical Examination:  General - thin, some accessory muscle use, speaking in full sentences HEENT - no sinus tenderness Cardiac - s1s2 regular Chest - prolonged exhalation, faint b/l wheeze Abd - soft, nontender Ext - no edema Neuro - normal strength Psych - normal mood, behavior  Labs and Imaging:    CBC    Component Value Date/Time   WBC 11.1* 06/07/2011 0600   RBC 5.22 06/07/2011 0600   HGB 16.8 06/07/2011 0600   HCT 49.6 06/07/2011 0600   PLT 181 06/07/2011 0600   MCV 95.0 06/07/2011 0600   MCH 32.2 06/07/2011 0600   MCHC 33.9 06/07/2011 0600   RDW 12.3 06/07/2011 0600   LYMPHSABS 0.7 06/04/2011 1808   MONOABS 0.6 06/04/2011 1808   EOSABS 0.0 06/04/2011 1808   BASOSABS 0.0 06/04/2011 1808   BMET    Component Value Date/Time   NA 136 06/07/2011 0600   K 4.8 06/07/2011 0600   CL 96 06/07/2011 0600   CO2 31 06/07/2011 0600   GLUCOSE 137* 06/07/2011 0600   BUN 29* 06/07/2011 0600   CREATININE 0.80 06/07/2011 0600   CALCIUM 9.5 06/07/2011 0600   GFRNONAA >90 06/07/2011 0600   GFRAA >90  06/07/2011 0600     Assessment and Plan: 56 yo male smoker with AECOPD and acute hypoxia.     COPD exacerbation  -D5/7 levaquin -wean solumedrol -continue scheduled xopenex nebulizer -continue advair -add spiriva -?if he would be candidate for daliresp -d/c BPAP  Hypoxic Respiratory failure>>improving. -titrate oxygen to keep SpO2 > 90% -will need assessment for home oxygen prior to discharge  Tobacco Abuse -nicotine patch -will need education for smoking cessation  Sinus Tachycardia>>improved. -Continue xopenex -monitor heart rhythm  Disposition -transfer to telemetry, keep on PCCM service for now  Coralyn Helling, MD 06/08/2011, 8:50 AM Pager:  [phone]

## 2011-06-09 DIAGNOSIS — J449 Chronic obstructive pulmonary disease, unspecified: Secondary | ICD-10-CM

## 2011-06-09 DIAGNOSIS — J9600 Acute respiratory failure, unspecified whether with hypoxia or hypercapnia: Secondary | ICD-10-CM

## 2011-06-09 LAB — BASIC METABOLIC PANEL
Chloride: 99 mEq/L (ref 96–112)
Creatinine, Ser: 0.65 mg/dL (ref 0.50–1.35)
GFR calc Af Amer: >90 mL/min (ref >90)
GFR calc non Af Amer: >90 mL/min (ref >90)
Potassium: 4.4 mEq/L (ref 3.5–5.1)

## 2011-06-09 LAB — CBC
HCT: 46.4 % (ref 39.0–52.0)
Hemoglobin: 15.7 g/dL (ref 13.0–17.0)
RDW: 12.2 % (ref 11.5–15.5)
WBC: 9 10*3/uL (ref 4.0–10.5)

## 2011-06-09 NOTE — Progress Notes (Signed)
Name: [name] MRN: [id number] DOB: 1955/10/12  LOS: 5  PCCM PROGRESS NOTE  History of Present Illness:  20 yowm smoker admitted on 06/04/2011 by Dr. Benjaman Kindler with hypoxemia, dyspnea, and AECOPD.  Transferred to ICU on 1/16 with increased dyspnea, needing BPAP, and PCCM consulted.   Subjective/Overnight Events:   Feels breathing better.  Maintaining oxygen sat's with venturi mask.     Cultures / Sepsis markers: 1/16 sputum > rejected   Antibiotics: 1/15 Levaquin (AECOPD)>>  Tests / Events: 1/16transferred to 2900 for BIPAP 1/19 tx back to floor      Vital Signs:   Temp:  [97.2 F (36.2 C)-97.3 F (36.3 C)] 97.2 F (36.2 C) (01/20 0447) Pulse Rate:  [89-101] 95  (01/20 0447) Resp:  [20-22] 22  (01/20 0447) BP: (132-144)/(81-87) 132/87 mmHg (01/20 0447) SpO2:  [91 %-96 %] 91 % (01/20 0904) FiO2 (%):  [28 %] 28 % (01/20 0250) Weight:  [106 lb 6.4 oz (48.263 kg)] 106 lb 6.4 oz (48.263 kg) (01/20 0447)  Can't tolerate nasal 02 but sats 91 on 0.28 fio2 venturi mask  Physical Examination:  General - thin, some accessory muscle use, speaking in full sentences HEENT - no sinus tenderness Cardiac - s1s2 regular Chest - prolonged exhalation, faint b/l wheeze Abd - soft, nontender Ext - no edema Neuro - normal strength Psych - hyper speech/ anxious  Labs and Imaging:    Lab 06/09/11 0500 06/07/11 0600 06/06/11 0555  NA 136 136 133*  K 4.4 4.8 4.6  CL 99 96 95*  CO2 30 31 29   BUN 23 29* 28*  CREATININE 0.65 0.80 0.78  GLUCOSE 120* 137* 137*    Lab 06/09/11 0500 06/07/11 0600 06/06/11 0555  HGB 15.7 16.8 15.9  HCT 46.4 49.6 46.8  WBC 9.0 11.1* 10.9*  PLT 173 181 161     Assessment and Plan: 56 yo male smoker with AECOPD and acute hypoxia.     COPD exacerbation  - Levaquin x 5/7 days planned for aecopd s def pna -wean solumedrol -continue scheduled xopenex nebulizer -continue advair - spiriva      Hypoxic Respiratory  failure>>improving. -titrate oxygen to keep SpO2 > 90% -will need assessment for home oxygen prior to discharge  Tobacco Abuse -nicotine patch -will need education for smoking cessation, the most important aspect of his care  Sinus Tachycardia>>improved. -Continue xopenex -monitor heart rhythm  Disposition -transfer back to Dr Newell Coral service 1/21  Sandrea Hughs, MD Pulmonary and Critical Care Medicine [facility] Cell [phone]

## 2011-06-10 DIAGNOSIS — R000 Tachycardia, unspecified: Secondary | ICD-10-CM

## 2011-06-10 DIAGNOSIS — J449 Chronic obstructive pulmonary disease, unspecified: Secondary | ICD-10-CM

## 2011-06-10 DIAGNOSIS — J9600 Acute respiratory failure, unspecified whether with hypoxia or hypercapnia: Secondary | ICD-10-CM

## 2011-06-10 MED ORDER — PREDNISONE 20 MG PO TABS
40.0000 mg | ORAL_TABLET | Freq: Every day | ORAL | Status: DC
  Administered 2011-06-11 – 2011-06-12 (×2): 40 mg via ORAL
  Filled 2011-06-10 (×3): qty 2

## 2011-06-10 NOTE — Progress Notes (Signed)
Name: [name] MRN: [id number] DOB: 06-Jun-1955  LOS: 6  PCCM PROGRESS NOTE  History of Present Illness:  56 yowm smoker admitted on 06/04/2011 by Dr. Benjaman Kindler with hypoxemia, dyspnea, and AECOPD.  Transferred to ICU on 1/16 with increased dyspnea, needing BPAP, and PCCM consulted.   Subjective/Overnight Events:      Cultures / Sepsis markers: 1/16 sputum > rejected   Antibiotics: 1/15 Levaquin (AECOPD)>>  Tests / Events: 1/16transferred to 2900 for BIPAP 1/19 tx back to floor     Vital Signs:   Temp:  [97 F (36.1 C)-97.5 F (36.4 C)] 97 F (36.1 C) (01/21 0446) Pulse Rate:  [94-106] 94  (01/21 0446) Resp:  [20-22] 22  (01/21 0446) BP: (136-138)/(80-99) 138/99 mmHg (01/21 0446) SpO2:  [90 %-98 %] 98 % (01/21 0947) FiO2 (%):  [28 %] 28 % (01/21 0947) Weight:  [47.628 kg (105 lb)] 47.628 kg (105 lb) (01/21 0446)  Can't tolerate nasal 02 but sats 91 on 0.28 fio2 venturi mask  Physical Examination:  General - thin, some accessory muscle use, speaking in full sentences HEENT - no sinus tenderness Cardiac - s1s2 regular Chest - prolonged exhalation, faint b/l wheeze Abd - soft, nontender Ext - no edema Neuro - normal strength Psych - hyper speech/ anxious  Labs and Imaging:    Lab 06/09/11 0500 06/07/11 0600 06/06/11 0555  NA 136 136 133*  K 4.4 4.8 4.6  CL 99 96 95*  CO2 30 31 29   BUN 23 29* 28*  CREATININE 0.65 0.80 0.78  GLUCOSE 120* 137* 137*    Lab 06/09/11 0500 06/07/11 0600 06/06/11 0555  HGB 15.7 16.8 15.9  HCT 46.4 49.6 46.8  WBC 9.0 11.1* 10.9*  PLT 173 181 161     Assessment and Plan: 56 yo male smoker with hypoxia AECOPD and acute hypoxia.  COPD exacerbation  - d/c levoflox today - solumedrol changed to pred today, initiate taper - continue scheduled xopenex nebulizer - continue advair + spiriva   Hypoxic Respiratory failure>>improving. -titrate oxygen to keep SpO2 > 90% -will need assessment for home oxygen prior to  discharge  Tobacco Abuse -nicotine patch -will need education for smoking cessation, the most important aspect of his care  Sinus Tachycardia>>improved. -Continue xopenex -monitor heart rhythm  Disposition -transfer back to Dr Newell Coral service 1/21, please call if we can help  Levy Pupa, MD, PhD 06/10/2011, 12:26 PM [place] [facility] [phone] or if no answer [phone]

## 2011-06-10 NOTE — Progress Notes (Signed)
Resp Care Note: Patient walked in hallway for approximately 60 yard on room air with HR 110-120, RR 18, SpO2 at start was 90% dropped to 84% without O2.  Patient place on Nasal cannula @ 2l/m.  HR 112, RR 24, SpO2 increased to 91% RR decreased back to baseline.  Walked total distance on unit hallway with 2 l/m O2.  HR 116, SpO2 92%, RR 20 after walk.  Patient remains on Opp O2 after walk.

## 2011-06-10 NOTE — Progress Notes (Signed)
Subjective: Doing better. Appreciate CCM care of patient. Noted that they planned to transfer back to me today so I went ahead and saw him.   Lots of questions, including "do I have infection or not", "do I need oxygen or not", "when am I getting out", etc etc. Spent 30 minutes answering questions.   Objective: Weight change: -0.972 kg (-2 lb 2.3 oz)  Intake/Output Summary (Last 24 hours) at 06/10/11 0751 Last data filed at 06/10/11 0452  Gross per 24 hour  Intake    840 ml  Output    700 ml  Net    140 ml   BP 138/99  Pulse 94  Temp(Src) 97 F (36.1 C) (Axillary)  Resp 22  Ht 5\' 3"  (1.6 m)  Wt 47.628 kg (105 lb)  BMI 18.60 kg/m2  SpO2 92% Lungs: clear to auscultation bilaterally but decrease breath sounds  Lab Results:  Basename 06/09/11 0500  NA 136  K 4.4  CL 99  CO2 30  GLUCOSE 120*  BUN 23  CREATININE 0.65  CALCIUM 9.4  MG --  PHOS --    Basename 06/09/11 0500  WBC 9.0  NEUTROABS --  HGB 15.7  HCT 46.4  MCV 94.7  PLT 173    Studies/Results: No results found. Medications: Scheduled Meds:   . antiseptic oral rinse  15 mL Mouth Rinse q12n4p  . chlorhexidine  15 mL Mouth Rinse BID  . Fluticasone-Salmeterol  1 puff Inhalation Q12H  . heparin  5,000 Units Subcutaneous Q8H  . levalbuterol  0.63 mg Nebulization Q6H  . levofloxacin  500 mg Oral Daily  . methylPREDNISolone (SOLU-MEDROL) injection  40 mg Intravenous Q8H  . nicotine  14 mg Transdermal Q breakfast  . sodium chloride  3 mL Intravenous Q12H  . tiotropium  18 mcg Inhalation Daily   Continuous Infusions:  PRN Meds:.acetaminophen, acetaminophen, levalbuterol, sodium chloride  Assessment/Plan: Principal Problem:  *COPD exacerbation - slowly improving. Will see if he can use nasal cannulae for oxygen support today. He has long term chronic nasal congestion so may not be possible, but we will see. He will need to be on nasal canualae prior to d/c. He is still on pretty high dose IV steroids.  Would plan on starting to slowly taper, but would appreciate pulm recommendation on that. Active Problems:  Respiratory failure  Tobacco abuse - smoking cessation counseling ordered.    LOS: 6 days   Lynnell Fiumara CHARLES 06/10/2011, 7:51 AM

## 2011-06-10 NOTE — Plan of Care (Signed)
Problem: Consults Goal: Respiratory Problems Patient Education See Patient Education Module for education specifics.  Outcome: Adequate for Discharge Pt given 3 handouts regarding Tobacco cessation.Given handouts on tips on how to not restart and handle stress. Pt states he knows he needs to quit. Using nicotine patches while in hospital.

## 2011-06-11 MED ORDER — LEVALBUTEROL TARTRATE 45 MCG/ACT IN AERO
2.0000 | INHALATION_SPRAY | Freq: Four times a day (QID) | RESPIRATORY_TRACT | Status: DC | PRN
  Filled 2011-06-11: qty 15

## 2011-06-11 MED ORDER — LEVALBUTEROL TARTRATE 45 MCG/ACT IN AERO
2.0000 | INHALATION_SPRAY | Freq: Four times a day (QID) | RESPIRATORY_TRACT | Status: DC
  Administered 2011-06-11 (×3): 2 via RESPIRATORY_TRACT
  Filled 2011-06-11: qty 15

## 2011-06-11 NOTE — Progress Notes (Signed)
Subjective: Feeling better. Able to ambulate with O2. No cough. Dyspnea is controlled while on O2.  Objective: Weight change: 0.771 kg (1 lb 11.2 oz)  Intake/Output Summary (Last 24 hours) at 06/11/11 0618 Last data filed at 06/11/11 0529  Gross per 24 hour  Intake    920 ml  Output    950 ml  Net    -30 ml   BP 115/77  Pulse 90  Temp(Src) 97.7 F (36.5 C) (Oral)  Resp 18  Ht 5\' 3"  (1.6 m)  Wt 48.399 kg (106 lb 11.2 oz)  BMI 18.90 kg/m2  SpO2 96%  Lab Results:  Basename 06/09/11 0500  NA 136  K 4.4  CL 99  CO2 30  GLUCOSE 120*  BUN 23  CREATININE 0.65  CALCIUM 9.4  MG --  PHOS --    Basename 06/09/11 0500  WBC 9.0  NEUTROABS --  HGB 15.7  HCT 46.4  MCV 94.7  PLT 173   Studies/Results: No results found. Medications: Scheduled Meds:   . antiseptic oral rinse  15 mL Mouth Rinse q12n4p  . chlorhexidine  15 mL Mouth Rinse BID  . Fluticasone-Salmeterol  1 puff Inhalation Q12H  . heparin  5,000 Units Subcutaneous Q8H  . levalbuterol  0.63 mg Nebulization Q6H  . levofloxacin  500 mg Oral Daily  . nicotine  14 mg Transdermal Q breakfast  . predniSONE  40 mg Oral Q breakfast  . sodium chloride  3 mL Intravenous Q12H  . tiotropium  18 mcg Inhalation Daily  . DISCONTD: methylPREDNISolone (SOLU-MEDROL) injection  40 mg Intravenous Q8H   Continuous Infusions:  PRN Meds:.acetaminophen, acetaminophen, levalbuterol, sodium chloride  Assessment/Plan: Principal Problem:  *COPD exacerbation - continues slow improvement. Our goal today is to see if he can get by with 2 LPM. That would make oxygen use at home, which I hope will be temporary and measured in weeks to months, and not permanent. I did tell him that his Hb was > normal and that might mean he has long term oxygen need.  Active Problems:  Respiratory failure  Tobacco abuse - discussed nicotine replacement vs Chantix. He prefers nicotine replacement as he has had problems with bupropion.    LOS: 7 days    Nelly Scriven CHARLES 06/11/2011, 6:18 AM

## 2011-06-11 NOTE — Progress Notes (Signed)
Patient walked the hallway, patient's O2 sats before walking on 2L [place] was 93%. Patient's O2 sats dropped as low as 90%.____________________________________________________________________________D. Manson Passey RN

## 2011-06-11 NOTE — Progress Notes (Signed)
UR Completed.  Lyfe Reihl Jane [phone] 04/20/2012  

## 2011-06-11 NOTE — Progress Notes (Signed)
Walked patient in hallway, O2 sats dropped as low as 88% on room air, patient did not want to apply oxygen and wanted to stop and take deep breaths to increase oxygen level.  Patient appeared very short of breath but oxygen level did increase after patient took a few deep breaths.________________________________________D. Manson Passey RN

## 2011-06-11 NOTE — Progress Notes (Signed)
Pt. Ambulated in hallway with tech. Pt. Started on oxygen [place] 3L ambulated started at 97% walked down the hallway and sustained O2 SAT at 92%. Pt. Them ambulated back down the hall without oxygen on and SAT went to 89% for a few seconds then sustained at 92% until pt. Returned to room.  Pt. Tolerated ambulation well.

## 2011-06-12 MED ORDER — NICOTINE 14 MG/24HR TD PT24: 1.0000 | MEDICATED_PATCH | Freq: Every day | TRANSDERMAL | Status: AC

## 2011-06-12 MED ORDER — TIOTROPIUM BROMIDE MONOHYDRATE 18 MCG IN CAPS: 18.0000 ug | ORAL_CAPSULE | Freq: Every day | RESPIRATORY_TRACT | Status: DC

## 2011-06-12 MED ORDER — LEVALBUTEROL TARTRATE 45 MCG/ACT IN AERO: 2.0000 | INHALATION_SPRAY | Freq: Four times a day (QID) | RESPIRATORY_TRACT | Status: DC | PRN

## 2011-06-12 MED ORDER — PREDNISONE 20 MG PO TABS: ORAL_TABLET | ORAL | Status: DC

## 2011-06-12 NOTE — Discharge Summary (Signed)
Physician Discharge Summary  NAME:Esker T Angelino  JXB:[id number]  DOB: Jun 14, 1955   Admit date: 06/04/2011 Discharge date: 06/12/2011  Admitting Diagnosis: Shortness of breath  Discharge Diagnoses:  Principal Problem:  *COPD exacerbation Active Problems:  Respiratory failure  Tobacco abuse  Discharge Condition: Improved  Hospital Course: The patient is a 56 year old white male who was admitted with a several week history of shortness of breath. In the office his oxygen saturation was 70% and he was admitted.  He was initially admitted to the telemetry floor. He was quite short of breath admission and rapid response team was called. Things settle down and he was able to remain on the telemetry floor that night. However, early the next morning, he developed increasing shortness of breath and was transferred to the unit.  In the intensive care unit he was cared for by CCM. He required BiPAP for several days but never had to be intubated. He was treated with high doses of Solu-Medrol and inhalers. Antibiotics were also added in the form of Levaquin. Over the subsequent several days things settle down he was able to be transferred to the floor. A seven-day course of Levaquin was finished. He was switched to prednisone. We established oxygen need at 2 L per minute on the basis of oxygen saturations of 86% on room air with ambulation.  Consults: CCM.  Disposition: Home.  Discharge Orders    Future Orders Please Complete By Expires   Diet - low sodium heart healthy      Increase activity slowly      Discharge instructions      Comments:   Use oxygen at all times. No smoking. Use nicotine patch x4 weeks at 14 mg daily, then decrease to 7 mg daily. We will call you for a followup appointment.     Medication List  As of 06/12/2011  7:50 AM   STOP taking these medications         NYQUIL D COLD/FLU PO         TAKE these medications         Fluticasone-Salmeterol 250-50 MCG/DOSE Aepb   Commonly known as: ADVAIR   Inhale 1 puff into the lungs every 12 (twelve) hours.      levalbuterol 45 MCG/ACT inhaler   Commonly known as: XOPENEX HFA   Inhale 2 puffs into the lungs every 6 (six) hours as needed for wheezing.      nicotine 14 mg/24hr patch   Commonly known as: NICODERM CQ - dosed in mg/24 hours   Place 1 patch onto the skin daily with breakfast.      predniSONE 20 MG tablet   Commonly known as: DELTASONE   Two tablets daily for one week, then one tablet daily for one week and then discontinue.      tiotropium 18 MCG inhalation capsule   Commonly known as: SPIRIVA   Place 1 capsule (18 mcg total) into inhaler and inhale daily.           Things to follow up in the outpatient setting: Establish need fraction on an ongoing basis.  Time coordinating discharge: 35 minutes.  SignedDarnelle Bos 06/12/2011, 7:50 AM

## 2011-06-12 NOTE — Progress Notes (Signed)
Pt's tele and IV has been d/c; patient verbalizes understanding of discharge instructions______________________________________________________________________________________________________________D. Manson Passey RN

## 2011-06-12 NOTE — Progress Notes (Signed)
Pt on oxygen at 2l/min, pt placed on room air , decreasing oxygen sat noted and with ambulation oxygen sat dropped to 87%. Pt placed back on 2l/ min and oxygen sat returned to 90%.  Will order oxygen for home use from [facility] supply, the preferred vender for pt insurance provider.  Johny Shock RN MPH Case manager [phone]

## 2011-06-12 NOTE — Progress Notes (Signed)
Pt's O2 sats before ambulating hallway was 92% on 2L [place], patient ambulated hallway on room air and O2 sats dropped to 87%, patient placed on 2L [place] of O2 and sats increased to 90%.____________________D. Manson Passey RN

## 2011-06-12 NOTE — Progress Notes (Signed)
   CARE MANAGEMENT NOTE 06/12/2011  Patient:  [name], [name]   Account Number:  [id number]  Date Initiated:  06/05/2011  Documentation initiated by:  Ronny Flurry  Subjective/Objective Assessment:   DX: COPD     Action/Plan:   Met with pt , and ordered home oxygen from [employer] as preferred provider for pt insurance provider.   Anticipated DC Date:  06/12/2011   Anticipated DC Plan:  HOME/SELF CARE         PAC Choice  DURABLE MEDICAL EQUIPMENT   Choice offered to / List presented to:  C-1 Patient   DME arranged  [facility]      DME agency  [facility]        Status of service:  Completed, signed off Medicare Important Message given?   (If response is "NO", the following Medicare IM given date fields will be blank) Date Medicare IM given:   Date Additional Medicare IM given:    Discharge Disposition:  HOME/SELF CARE  Per UR Regulation:  Reviewed for med. necessity/level of care/duration of stay  Comments:  06-11-11 8:50am Avie Arenas, RNBSN - [phone] UR completed - may need oxygen for home.

## 2011-07-17 ENCOUNTER — Encounter (HOSPITAL_COMMUNITY): Payer: Self-pay

## 2011-07-17 ENCOUNTER — Encounter (HOSPITAL_COMMUNITY)
Admission: RE | Admit: 2011-07-17 | Discharge: 2011-07-17 | Disposition: A | Discharge status: Home / Self Care | Payer: Managed Care, Other (non HMO) | Source: Ambulatory Visit | Attending: Internal Medicine | Admitting: Internal Medicine

## 2011-07-17 NOTE — Progress Notes (Signed)
[name] came today for Pulmonary Orientation.  Demonstration and practice of PLB using pulse oximeter.  Patient able to return demonstration satisfactorily. We also did demonstration and practice of PLB technique.  Patient able to return demonstration satisfactorily.  Safety and hand hygiene in the exercise area reviewed with patient.  Patient voices understanding.

## 2011-07-18 ENCOUNTER — Encounter (HOSPITAL_COMMUNITY): Payer: Managed Care, Other (non HMO)

## 2011-07-23 ENCOUNTER — Encounter (HOSPITAL_COMMUNITY)
Admission: RE | Admit: 2011-07-23 | Discharge: 2011-07-23 | Disposition: A | Discharge status: Home / Self Care | Payer: Managed Care, Other (non HMO) | Source: Ambulatory Visit | Attending: Internal Medicine | Admitting: Internal Medicine

## 2011-07-23 DIAGNOSIS — Z5189 Encounter for other specified aftercare: Secondary | ICD-10-CM | POA: Insufficient documentation

## 2011-07-23 DIAGNOSIS — IMO0002 Reserved for concepts with insufficient information to code with codable children: Secondary | ICD-10-CM | POA: Insufficient documentation

## 2011-07-23 DIAGNOSIS — F17200 Nicotine dependence, unspecified, uncomplicated: Secondary | ICD-10-CM | POA: Insufficient documentation

## 2011-07-23 DIAGNOSIS — I498 Other specified cardiac arrhythmias: Secondary | ICD-10-CM | POA: Insufficient documentation

## 2011-07-23 DIAGNOSIS — J441 Chronic obstructive pulmonary disease with (acute) exacerbation: Secondary | ICD-10-CM | POA: Insufficient documentation

## 2011-07-23 DIAGNOSIS — R0902 Hypoxemia: Secondary | ICD-10-CM | POA: Insufficient documentation

## 2011-07-23 DIAGNOSIS — Z79899 Other long term (current) drug therapy: Secondary | ICD-10-CM | POA: Insufficient documentation

## 2011-07-23 NOTE — Progress Notes (Signed)
First day of exercise for [name]  He was oriented to equipment use, safety, rest breaks, RPE and Dyspnea Scale.  Demonstration, practice of PLB on each exercise station. Tolerated exercise well, vital signs stable. Oxygen dropped to 88% RA with exertion, so we discussed the use of oxygen for the next exercise session. Patient voiced understanding and agreed to wear oxygen with exertion so patient can be fast tracked. Will continue to encourage and support.

## 2011-07-25 ENCOUNTER — Encounter (HOSPITAL_COMMUNITY)
Admission: RE | Admit: 2011-07-25 | Discharge: 2011-07-25 | Disposition: A | Discharge status: Home / Self Care | Payer: Managed Care, Other (non HMO) | Source: Ambulatory Visit | Attending: Internal Medicine | Admitting: Internal Medicine

## 2011-07-25 NOTE — Progress Notes (Signed)
Today we used oxygen for exercise due to low saturations on last visit.  Tolerated well.  He feels he could walk with less shortness of breath with the oxygen.

## 2011-07-30 ENCOUNTER — Encounter (HOSPITAL_COMMUNITY)
Admission: RE | Admit: 2011-07-30 | Discharge: 2011-07-30 | Disposition: A | Discharge status: Home / Self Care | Payer: Managed Care, Other (non HMO) | Source: Ambulatory Visit | Attending: Internal Medicine | Admitting: Internal Medicine

## 2011-08-01 ENCOUNTER — Encounter (HOSPITAL_COMMUNITY)
Admission: RE | Admit: 2011-08-01 | Discharge: 2011-08-01 | Disposition: A | Discharge status: Home / Self Care | Payer: Managed Care, Other (non HMO) | Source: Ambulatory Visit | Attending: Internal Medicine | Admitting: Internal Medicine

## 2011-08-06 ENCOUNTER — Encounter (HOSPITAL_COMMUNITY)
Admission: RE | Admit: 2011-08-06 | Discharge: 2011-08-06 | Disposition: A | Discharge status: Home / Self Care | Payer: Managed Care, Other (non HMO) | Source: Ambulatory Visit | Attending: Internal Medicine | Admitting: Internal Medicine

## 2011-08-06 NOTE — Progress Notes (Signed)
Completed home exercise with patient. Reviewed exercise progression, routine, exercising at a comfortable pace, RPE/Dyspnea scales, how important it is to own a pulse oximeter and how to use one, weather conditions, warning signs and symptoms with exercise, and CP/NTG. We discussed when to call MD. Patient voices understanding. Patient has a goal to breathe better while doing ADL's and jog again. Explained that patient will need to wear oxygen at home. Will continue to encourage and support.
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
## 2011-08-08 ENCOUNTER — Encounter (HOSPITAL_COMMUNITY)
Admission: RE | Admit: 2011-08-08 | Discharge: 2011-08-08 | Disposition: A | Discharge status: Home / Self Care | Payer: Managed Care, Other (non HMO) | Source: Ambulatory Visit | Attending: Internal Medicine | Admitting: Internal Medicine

## 2011-08-13 ENCOUNTER — Encounter (HOSPITAL_COMMUNITY)
Admission: RE | Admit: 2011-08-13 | Discharge: 2011-08-13 | Disposition: A | Discharge status: Home / Self Care | Payer: Managed Care, Other (non HMO) | Source: Ambulatory Visit | Attending: Internal Medicine | Admitting: Internal Medicine

## 2011-08-15 ENCOUNTER — Encounter (HOSPITAL_COMMUNITY)
Admission: RE | Admit: 2011-08-15 | Discharge: 2011-08-15 | Disposition: A | Discharge status: Home / Self Care | Payer: Managed Care, Other (non HMO) | Source: Ambulatory Visit | Attending: Internal Medicine | Admitting: Internal Medicine

## 2011-08-20 ENCOUNTER — Encounter (HOSPITAL_COMMUNITY)
Admission: RE | Admit: 2011-08-20 | Discharge: 2011-08-20 | Disposition: A | Discharge status: Home / Self Care | Payer: Managed Care, Other (non HMO) | Source: Ambulatory Visit | Attending: Internal Medicine | Admitting: Internal Medicine

## 2011-08-20 DIAGNOSIS — Z79899 Other long term (current) drug therapy: Secondary | ICD-10-CM | POA: Insufficient documentation

## 2011-08-20 DIAGNOSIS — IMO0002 Reserved for concepts with insufficient information to code with codable children: Secondary | ICD-10-CM | POA: Insufficient documentation

## 2011-08-20 DIAGNOSIS — F17200 Nicotine dependence, unspecified, uncomplicated: Secondary | ICD-10-CM | POA: Insufficient documentation

## 2011-08-20 DIAGNOSIS — I498 Other specified cardiac arrhythmias: Secondary | ICD-10-CM | POA: Insufficient documentation

## 2011-08-20 DIAGNOSIS — J441 Chronic obstructive pulmonary disease with (acute) exacerbation: Secondary | ICD-10-CM | POA: Insufficient documentation

## 2011-08-20 DIAGNOSIS — R0902 Hypoxemia: Secondary | ICD-10-CM | POA: Insufficient documentation

## 2011-08-20 DIAGNOSIS — Z5189 Encounter for other specified aftercare: Secondary | ICD-10-CM | POA: Insufficient documentation

## 2011-08-22 ENCOUNTER — Encounter (HOSPITAL_COMMUNITY)
Admission: RE | Admit: 2011-08-22 | Discharge: 2011-08-22 | Disposition: A | Discharge status: Home / Self Care | Payer: Managed Care, Other (non HMO) | Source: Ambulatory Visit | Attending: Internal Medicine | Admitting: Internal Medicine

## 2011-08-27 ENCOUNTER — Encounter (HOSPITAL_COMMUNITY)
Admission: RE | Admit: 2011-08-27 | Discharge: 2011-08-27 | Disposition: A | Discharge status: Home / Self Care | Payer: Managed Care, Other (non HMO) | Source: Ambulatory Visit | Attending: Internal Medicine | Admitting: Internal Medicine

## 2011-08-29 ENCOUNTER — Encounter (HOSPITAL_COMMUNITY)
Admission: RE | Admit: 2011-08-29 | Discharge: 2011-08-29 | Disposition: A | Discharge status: Home / Self Care | Payer: Managed Care, Other (non HMO) | Source: Ambulatory Visit | Attending: Internal Medicine | Admitting: Internal Medicine

## 2011-09-03 ENCOUNTER — Encounter (HOSPITAL_COMMUNITY)
Admission: RE | Admit: 2011-09-03 | Discharge: 2011-09-03 | Disposition: A | Discharge status: Home / Self Care | Payer: Managed Care, Other (non HMO) | Source: Ambulatory Visit | Attending: Internal Medicine | Admitting: Internal Medicine

## 2011-09-05 ENCOUNTER — Encounter (HOSPITAL_COMMUNITY)
Admission: RE | Admit: 2011-09-05 | Discharge: 2011-09-05 | Disposition: A | Discharge status: Home / Self Care | Payer: Managed Care, Other (non HMO) | Source: Ambulatory Visit | Attending: Internal Medicine | Admitting: Internal Medicine

## 2011-09-10 ENCOUNTER — Encounter (HOSPITAL_COMMUNITY)
Admission: RE | Admit: 2011-09-10 | Discharge: 2011-09-10 | Disposition: A | Discharge status: Home / Self Care | Payer: Managed Care, Other (non HMO) | Source: Ambulatory Visit | Attending: Internal Medicine | Admitting: Internal Medicine

## 2011-09-12 ENCOUNTER — Encounter (HOSPITAL_COMMUNITY): Payer: Managed Care, Other (non HMO)

## 2011-09-13 ENCOUNTER — Ambulatory Visit
Admission: RE | Admit: 2011-09-13 | Discharge: 2011-09-13 | Disposition: A | Discharge status: Home / Self Care | Payer: Managed Care, Other (non HMO) | Source: Ambulatory Visit | Attending: *Deleted | Admitting: *Deleted

## 2011-09-13 ENCOUNTER — Other Ambulatory Visit: Payer: Self-pay | Admitting: *Deleted

## 2011-09-13 DIAGNOSIS — R1030 Lower abdominal pain, unspecified: Secondary | ICD-10-CM

## 2011-09-13 MED ORDER — IOHEXOL 300 MG/ML  SOLN
100.0000 mL | Freq: Once | INTRAMUSCULAR | Status: AC | PRN
  Administered 2011-09-13: 100 mL via INTRAVENOUS

## 2011-09-17 ENCOUNTER — Encounter (HOSPITAL_COMMUNITY): Payer: Managed Care, Other (non HMO)

## 2011-09-19 ENCOUNTER — Encounter (HOSPITAL_COMMUNITY)
Admission: RE | Admit: 2011-09-19 | Discharge: 2011-09-19 | Disposition: A | Discharge status: Home / Self Care | Payer: Managed Care, Other (non HMO) | Source: Ambulatory Visit | Attending: Internal Medicine | Admitting: Internal Medicine

## 2011-09-19 DIAGNOSIS — IMO0002 Reserved for concepts with insufficient information to code with codable children: Secondary | ICD-10-CM | POA: Insufficient documentation

## 2011-09-19 DIAGNOSIS — R0902 Hypoxemia: Secondary | ICD-10-CM | POA: Insufficient documentation

## 2011-09-19 DIAGNOSIS — Z5189 Encounter for other specified aftercare: Secondary | ICD-10-CM | POA: Insufficient documentation

## 2011-09-19 DIAGNOSIS — I498 Other specified cardiac arrhythmias: Secondary | ICD-10-CM | POA: Insufficient documentation

## 2011-09-19 DIAGNOSIS — J441 Chronic obstructive pulmonary disease with (acute) exacerbation: Secondary | ICD-10-CM | POA: Insufficient documentation

## 2011-09-19 DIAGNOSIS — Z79899 Other long term (current) drug therapy: Secondary | ICD-10-CM | POA: Insufficient documentation

## 2011-09-19 DIAGNOSIS — F17200 Nicotine dependence, unspecified, uncomplicated: Secondary | ICD-10-CM | POA: Insufficient documentation

## 2011-09-24 ENCOUNTER — Encounter (HOSPITAL_COMMUNITY)
Admission: RE | Admit: 2011-09-24 | Discharge: 2011-09-24 | Disposition: A | Discharge status: Home / Self Care | Payer: Managed Care, Other (non HMO) | Source: Ambulatory Visit | Attending: Internal Medicine | Admitting: Internal Medicine

## 2011-09-26 ENCOUNTER — Encounter (HOSPITAL_COMMUNITY)
Admission: RE | Admit: 2011-09-26 | Discharge: 2011-09-26 | Disposition: A | Discharge status: Home / Self Care | Payer: Managed Care, Other (non HMO) | Source: Ambulatory Visit | Attending: Internal Medicine | Admitting: Internal Medicine

## 2011-10-01 ENCOUNTER — Encounter (HOSPITAL_COMMUNITY)
Admission: RE | Admit: 2011-10-01 | Discharge: 2011-10-01 | Disposition: A | Discharge status: Home / Self Care | Payer: Managed Care, Other (non HMO) | Source: Ambulatory Visit | Attending: Internal Medicine | Admitting: Internal Medicine

## 2011-10-03 ENCOUNTER — Encounter (HOSPITAL_COMMUNITY)
Admission: RE | Admit: 2011-10-03 | Discharge: 2011-10-03 | Disposition: A | Discharge status: Home / Self Care | Payer: Managed Care, Other (non HMO) | Source: Ambulatory Visit | Attending: Internal Medicine | Admitting: Internal Medicine

## 2011-10-08 ENCOUNTER — Encounter (HOSPITAL_COMMUNITY)
Admission: RE | Admit: 2011-10-08 | Discharge: 2011-10-08 | Disposition: A | Discharge status: Home / Self Care | Payer: Managed Care, Other (non HMO) | Source: Ambulatory Visit | Attending: Internal Medicine | Admitting: Internal Medicine

## 2011-10-10 ENCOUNTER — Encounter (HOSPITAL_COMMUNITY)
Admission: RE | Admit: 2011-10-10 | Discharge: 2011-10-10 | Disposition: A | Discharge status: Home / Self Care | Payer: Managed Care, Other (non HMO) | Source: Ambulatory Visit | Attending: Internal Medicine | Admitting: Internal Medicine

## 2011-10-15 ENCOUNTER — Encounter (HOSPITAL_COMMUNITY)
Admission: RE | Admit: 2011-10-15 | Discharge: 2011-10-15 | Disposition: A | Discharge status: Home / Self Care | Payer: Managed Care, Other (non HMO) | Source: Ambulatory Visit | Attending: Internal Medicine | Admitting: Internal Medicine

## 2011-10-17 ENCOUNTER — Encounter (HOSPITAL_COMMUNITY)
Admission: RE | Admit: 2011-10-17 | Discharge: 2011-10-17 | Disposition: A | Discharge status: Home / Self Care | Payer: Managed Care, Other (non HMO) | Source: Ambulatory Visit | Attending: Internal Medicine | Admitting: Internal Medicine

## 2011-10-22 ENCOUNTER — Encounter (HOSPITAL_COMMUNITY): Payer: Managed Care, Other (non HMO)

## 2011-10-24 ENCOUNTER — Encounter (HOSPITAL_COMMUNITY): Payer: Managed Care, Other (non HMO)

## 2011-10-29 ENCOUNTER — Encounter (HOSPITAL_COMMUNITY): Payer: Managed Care, Other (non HMO)

## 2011-10-31 ENCOUNTER — Encounter (HOSPITAL_COMMUNITY): Payer: Managed Care, Other (non HMO)

## 2011-11-05 ENCOUNTER — Encounter (HOSPITAL_COMMUNITY): Payer: Managed Care, Other (non HMO)

## 2011-11-18 NOTE — Progress Notes (Addendum)
Pulmonary Rehab Outcome Note Nutrition  There is some room for improvement in pt diet to become healthier for pt's lung disease.  Pt with desired 4.2 kg wt gain during rehab.  Pt now within his reported UBW. Section Completed by: Mickle Plumb, M.Ed, RD, LDN, CDE                               Pulmonary Rehabilitation Program Outcomes Report   Orientation:  07/17/2011 Graduate Date:  10/17/2011 # of sessions completed: 24   Family MD:  Marlaine Hind Time:  10:30am  A.  Exercise Program:  Tolerates exercise @ 5.1 METS for 45 minutes, Walk Test Results:  Pre: 1554ft and Post: 1653ft, Improved functional capacity  6.67 %, Improved  muscular strength  21.05 %, Improved dyspnea score 35.56 %, Improved education score 8.33 %, Exercise limited by dyspnea and Discharged to home exercise program.  Anticipated compliance:  excellent  B.  Mental Health:  Good mental attitude and Quality of Life (QOL)  changes:  Overall  1.84 %, Health/Functioning 23.63 %, Socioeconomics -11.29 %, Psych/Spiritual -17.39 %, Family -20.83 %    C.  Education/Instruction/Skills  Uses Perceived Exertion Scale and/or Dyspnea Scale and Attended 7 education classes  Home exercise given: 08/01/2011   D  Blood Lipids    No results found for this basename: CHOL, HDL, LDLCALC, LDLDIRECT, TRIG, CHOLHDL    F.  Lifestyle Changes:  Making positive lifestyle changes  G.  Symptoms noted with exercise:  Shortness of breath and Fatigue   Comments: Patient tolerated well with all exercises. Oxygen sats maintained above 90% on 2L [facility]. Increased workloads as necessary.  Very compliant and great to work with. Patient plans to come back and volunteer.   Courtney L. Manson Passey, MS, NASM, CES    Agree with above, Cathie Olden RN

## 2012-12-04 ENCOUNTER — Encounter: Payer: Self-pay | Admitting: Emergency Medicine

## 2012-12-04 ENCOUNTER — Ambulatory Visit (INDEPENDENT_AMBULATORY_CARE_PROVIDER_SITE_OTHER): Payer: Managed Care, Other (non HMO) | Admitting: Emergency Medicine

## 2012-12-04 VITALS — BP 108/60 | HR 86 | Temp 97.0°F | Ht 63.0 in | Wt 131.0 lb

## 2012-12-04 DIAGNOSIS — J449 Chronic obstructive pulmonary disease, unspecified: Secondary | ICD-10-CM

## 2012-12-04 NOTE — Assessment & Plan Note (Addendum)
COPD GOLD C- remains functional although he sees a decline over 12 months. He has documented hypoxemia with heavy exertion in the past, ? Whether this is at a lower threshold now. Averages less than 1 AE a year - ? Whether we can improve by increasing his ICS.  - will change symbicort to 160 as of his next refill mail off - continue spiriva - walking oximetry today - albuterol prn - discussed possible referral for transplant at some point. We will revisit in the future - full pft to compare with his spirometry from Dr Earl Gala.  - rov 3 months - will need to check his a1-AT phenotype, will discuss this w him next visit.

## 2012-12-04 NOTE — Patient Instructions (Addendum)
We will continue Spiriva daily Increase your Symbicort to 160/4.36mcg, 2 puffs twice a day. Rinse your mouth and gargle after using this inhaler Use albuterol as needed Walking oximetry today Full Pulmonary Function Testing at your next office visit Follow with Dr Delton Coombes in 3 months with full PFT, or sooner if you have any problems.
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
## 2012-12-21 ENCOUNTER — Telehealth: Payer: Self-pay | Admitting: Emergency Medicine

## 2012-12-21 MED ORDER — BUDESONIDE-FORMOTEROL FUMARATE 160-4.5 MCG/ACT IN AERO: 2.0000 | INHALATION_SPRAY | Freq: Two times a day (BID) | RESPIRATORY_TRACT | Status: DC

## 2012-12-21 NOTE — Telephone Encounter (Signed)
[facility]--LMTCB x1 for pt

## 2012-12-21 NOTE — Telephone Encounter (Signed)
Pt called back and made him aware. Nothing further needed

## 2013-03-10 ENCOUNTER — Telehealth: Payer: Self-pay | Admitting: Emergency Medicine

## 2013-03-10 NOTE — Telephone Encounter (Signed)
I spoke with pt. Advised him it was to remind him of his pending appt with RB 03/17/13 at 3 PM. Pt was also suppose to have PFT done and this was not scheduled. I advised pt will call over to [facility] to get this scheduled for same day. He is scheduled for 03/17/13 at 1:30 PFT at [facility]. Pt is aware and also recs for the breathing test. Nothing further needed

## 2013-03-17 ENCOUNTER — Ambulatory Visit (HOSPITAL_COMMUNITY)
Admission: RE | Admit: 2013-03-17 | Discharge: 2013-03-17 | Disposition: A | Discharge status: Home / Self Care | Payer: Managed Care, Other (non HMO) | Source: Ambulatory Visit | Attending: Emergency Medicine | Admitting: Emergency Medicine

## 2013-03-17 ENCOUNTER — Other Ambulatory Visit: Payer: Managed Care, Other (non HMO)

## 2013-03-17 ENCOUNTER — Ambulatory Visit (INDEPENDENT_AMBULATORY_CARE_PROVIDER_SITE_OTHER): Payer: Managed Care, Other (non HMO) | Admitting: Emergency Medicine

## 2013-03-17 ENCOUNTER — Encounter: Payer: Self-pay | Admitting: Emergency Medicine

## 2013-03-17 VITALS — BP 118/80 | HR 89 | Ht 64.0 in | Wt 125.0 lb

## 2013-03-17 DIAGNOSIS — J449 Chronic obstructive pulmonary disease, unspecified: Secondary | ICD-10-CM

## 2013-03-17 DIAGNOSIS — Z87891 Personal history of nicotine dependence: Secondary | ICD-10-CM | POA: Insufficient documentation

## 2013-03-17 DIAGNOSIS — J4489 Other specified chronic obstructive pulmonary disease: Secondary | ICD-10-CM | POA: Insufficient documentation

## 2013-03-17 LAB — PULMONARY FUNCTION TEST

## 2013-03-17 MED ORDER — ALBUTEROL SULFATE (5 MG/ML) 0.5% IN NEBU
2.5000 mg | INHALATION_SOLUTION | Freq: Once | RESPIRATORY_TRACT | Status: AC
  Administered 2013-03-17: 2.5 mg via RESPIRATORY_TRACT

## 2013-03-17 NOTE — Assessment & Plan Note (Signed)
Severe disease based on spiro, progressive sx clinically over about 1 yr.  - check a1-AT today - O2 with exertion.  - same BD's - discussed possible transplant referral w him. He would be interested in doing this in the future.

## 2013-03-17 NOTE — Patient Instructions (Signed)
Blood work today Continue your inhaled medications as you are taking them Follow with Dr Delton Coombes in 3 months or sooner if you have any problems.

## 2013-03-17 NOTE — Progress Notes (Signed)
  Subjective:    Patient ID: [name], male    DOB: 01-03-1956, 57 y.o.   MRN: [id number]  HPI 57 yo former smoker, 35 pk-yrs, with documented COPD. Followed by Dr Earl Gala and most recent FEV1 profoundly low at 0.54L (18% pred). On spiriva + symbicort 80. I met him originally in 2013 when he was admitted for an AE-COPD that required BiPAP. He is referred today to assess his meds and to discuss possible transplant referral at some point.    Functional capacity - he does have some limitations, no longer able to run, tough to swim in the ocean. He does stair w some difficulty. He is able to work. Has to rest some times.  Coughs occasionally, white mucous. Rare wheeze. Snores. No witnessed apneas. Uses SABA 2x week. Single AE since 2013.   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 Positive BD response, hyperinflation, decreased diffusion. We increased Symbicort to 160, continued spiriva. He has O2 at home that he uses at night, has started using using a bit with exertion but not reliably.     Review of Systems  Constitutional: Negative for fever and unexpected weight change.  HENT: Negative for congestion, dental problem, ear pain, nosebleeds, postnasal drip, rhinorrhea, sinus pressure, sneezing, sore throat and trouble swallowing.   Eyes: Negative for redness and itching.  Respiratory: Positive for cough and shortness of breath. Negative for chest tightness and wheezing.   Cardiovascular: Negative for palpitations and leg swelling.  Gastrointestinal: Negative for nausea and vomiting.  Genitourinary: Negative for dysuria.  Musculoskeletal: Negative for joint swelling.  Skin: Negative for rash.  Neurological: Negative for headaches.  Hematological: Does not bruise/bleed easily.  Psychiatric/Behavioral: Negative for dysphoric mood. The patient is nervous/anxious.        Objective:   Physical Exam Filed Vitals:   03/17/13 1505  BP: 118/80  Pulse: 89   Height: 5\' 4"  (1.626 m)  Weight: 125 lb (56.7 kg)  SpO2: 92%   Gen: Pleasant, well-nourished, in no distress,  normal affect  ENT: No lesions,  mouth clear,  oropharynx clear, no postnasal drip  Neck: No JVD, no TMG, no carotid bruits  Lungs: No use of accessory muscles, no dullness to percussion, clear without rales or rhonchi  Cardiovascular: RRR, heart sounds normal, no murmur or gallops, no peripheral edema  Musculoskeletal: No deformities, no cyanosis or clubbing  Neuro: alert, non focal  Skin: Warm, no lesions or rashes     Assessment & Plan:  COPD (chronic obstructive pulmonary disease) Severe disease based on spiro, progressive sx clinically over about 1 yr.  - check a1-AT today - O2 with exertion.  - same BD's - discussed possible transplant referral w him. He would be interested in doing this in the future.

## 2013-03-25 LAB — ALPHA-1 ANTITRYPSIN PHENOTYPE: A-1 Antitrypsin: 149 mg/dL (ref 83–199)

## 2013-03-29 ENCOUNTER — Telehealth: Payer: Self-pay | Admitting: Emergency Medicine

## 2013-03-29 MED ORDER — ALBUTEROL SULFATE HFA 108 (90 BASE) MCG/ACT IN AERS: 2.0000 | INHALATION_SPRAY | Freq: Four times a day (QID) | RESPIRATORY_TRACT | Status: DC | PRN

## 2013-03-29 NOTE — Telephone Encounter (Signed)
Rx has been sent in. Pt is aware. 

## 2013-04-05 ENCOUNTER — Telehealth: Payer: Self-pay | Admitting: Emergency Medicine

## 2013-04-05 MED ORDER — ALBUTEROL SULFATE HFA 108 (90 BASE) MCG/ACT IN AERS: 2.0000 | INHALATION_SPRAY | Freq: Four times a day (QID) | RESPIRATORY_TRACT | Status: DC | PRN

## 2013-04-05 NOTE — Telephone Encounter (Signed)
I spoke with pt. He reports proair is covered. I advised pt will send in RX. Nothing further eneded

## 2013-04-09 ENCOUNTER — Encounter: Payer: Self-pay | Admitting: Emergency Medicine

## 2013-04-22 ENCOUNTER — Telehealth: Payer: Self-pay | Admitting: Emergency Medicine

## 2013-04-22 NOTE — Telephone Encounter (Signed)
Pt is having issues with getting into his MyChart account. I tried to pull up the webpage and I had no problems. Pt has contacted IT and they are trying to help him with this problem.

## 2013-06-11 ENCOUNTER — Ambulatory Visit: Payer: Managed Care, Other (non HMO) | Admitting: Emergency Medicine

## 2013-06-25 ENCOUNTER — Ambulatory Visit (INDEPENDENT_AMBULATORY_CARE_PROVIDER_SITE_OTHER): Payer: Managed Care, Other (non HMO) | Admitting: Emergency Medicine

## 2013-06-25 ENCOUNTER — Encounter: Payer: Self-pay | Admitting: Emergency Medicine

## 2013-06-25 VITALS — BP 120/84 | HR 104 | Ht 63.0 in | Wt 124.4 lb

## 2013-06-25 DIAGNOSIS — J449 Chronic obstructive pulmonary disease, unspecified: Secondary | ICD-10-CM

## 2013-06-25 NOTE — Patient Instructions (Signed)
Please continue your current inhaled medications.  We talked today about some alternative medications including Titus Mouldudorza, Breo and Anoro We will inquire with your insurance about the appropriate transplant referral center.  Follow with Dr Delton CoombesByrum in 6 months or sooner if you have any problems

## 2013-06-25 NOTE — Assessment & Plan Note (Signed)
-   same BD's for now - we discussed possible alternatives, will revisit next time - will find out which transplant center would be accepted by his insurance

## 2013-06-25 NOTE — Progress Notes (Signed)
  Subjective:    Patient ID: [name], male    DOB: Apr 20, 1956, 58 y.o.   MRN: [id number]  HPI 58 yo former smoker, 35 pk-yrs, with documented COPD. Followed by Dr Maxwell Caul and most recent FEV1 profoundly low at 0.54L (18% pred). On spiriva + symbicort 80. I met him originally in 2013 when he was admitted for an AE-COPD that required BiPAP. He is referred today to assess his meds and to discuss possible transplant referral at some point.    Functional capacity - he does have some limitations, no longer able to run, tough to swim in the ocean. He does stair w some difficulty. He is able to work. Has to rest some times.  Coughs occasionally, white mucous. Rare wheeze. Snores. No witnessed apneas. Uses SABA 2x week. Single AE since 2013.   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 Positive BD response, hyperinflation, decreased diffusion. We increased Symbicort to 160, continued spiriva. He has O2 at home that he uses at night, has started using using a bit with exertion but not reliably.    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 His a1-AT genotype was normal variant MZpratt, with a normal level 149. He is not smoking. Feels well. No flares since last time. Uses albuterol . Uses O2 at night, with some exertion.   Review of Systems  Constitutional: Negative for fever and unexpected weight change.  HENT: Negative for congestion, dental problem, ear pain, nosebleeds, postnasal drip, rhinorrhea, sinus pressure, sneezing, sore throat and trouble swallowing.   Eyes: Negative for redness and itching.  Respiratory: Positive for cough and shortness of breath. Negative for chest tightness and wheezing.   Cardiovascular: Negative for palpitations and leg swelling.  Gastrointestinal: Negative for nausea and vomiting.  Genitourinary: Negative for dysuria.  Musculoskeletal: Negative for joint swelling.  Skin:  Negative for rash.  Neurological: Negative for headaches.  Hematological: Does not bruise/bleed easily.  Psychiatric/Behavioral: Negative for dysphoric mood. The patient is nervous/anxious.        Objective:   Physical Exam Filed Vitals:   06/25/13 1352  BP: 120/84  Pulse: 104  Height: $Remove'5\' 3"'CrxmBTR$  (1.6 m)  Weight: 124 lb 6.4 oz (56.427 kg)  SpO2: 90%   Gen: Pleasant, well-nourished, in no distress,  normal affect  ENT: No lesions,  mouth clear,  oropharynx clear, no postnasal drip  Neck: No JVD, no TMG, no carotid bruits  Lungs: No use of accessory muscles, no dullness to percussion, clear without rales or rhonchi  Cardiovascular: RRR, heart sounds normal, no murmur or gallops, no peripheral edema  Musculoskeletal: No deformities, no cyanosis or clubbing  Neuro: alert, non focal  Skin: Warm, no lesions or rashes     Assessment & Plan:  COPD (chronic obstructive pulmonary disease) - same BD's for now - we discussed possible alternatives, will revisit next time - will find out which transplant center would be accepted by his insurance

## 2013-06-30 ENCOUNTER — Telehealth: Payer: Self-pay | Admitting: Emergency Medicine

## 2013-07-01 NOTE — Telephone Encounter (Signed)
Pt is returning triage's call.  Holly D Pryor ° °

## 2013-07-01 NOTE — Telephone Encounter (Signed)
Spoke with the pt  He states had some upper abd pain and belching yesterday- took pepcid and symptoms were improved He denies any problems with this today, and states has no respiratory co's  I advised that if his symptoms should return to call PCP for recs  He verbalized understanding and states nothing further needed

## 2013-07-01 NOTE — Telephone Encounter (Signed)
LMTC x 1  

## 2013-08-02 ENCOUNTER — Ambulatory Visit (INDEPENDENT_AMBULATORY_CARE_PROVIDER_SITE_OTHER)
Admission: RE | Admit: 2013-08-02 | Discharge: 2013-08-02 | Disposition: A | Discharge status: Home / Self Care | Payer: Managed Care, Other (non HMO) | Source: Ambulatory Visit | Attending: Adult Health | Admitting: Adult Health

## 2013-08-02 ENCOUNTER — Encounter: Payer: Self-pay | Admitting: Adult Health

## 2013-08-02 ENCOUNTER — Ambulatory Visit (INDEPENDENT_AMBULATORY_CARE_PROVIDER_SITE_OTHER): Payer: Managed Care, Other (non HMO) | Admitting: Adult Health

## 2013-08-02 VITALS — BP 122/82 | HR 112 | Temp 97.9°F | Ht 63.0 in | Wt 121.2 lb

## 2013-08-02 DIAGNOSIS — J449 Chronic obstructive pulmonary disease, unspecified: Secondary | ICD-10-CM

## 2013-08-02 DIAGNOSIS — J4489 Other specified chronic obstructive pulmonary disease: Secondary | ICD-10-CM

## 2013-08-02 MED ORDER — DOXYCYCLINE HYCLATE 100 MG PO TABS: 100.0000 mg | ORAL_TABLET | Freq: Two times a day (BID) | ORAL | Status: DC

## 2013-08-02 MED ORDER — PREDNISONE 10 MG PO TABS: ORAL_TABLET | ORAL | Status: DC

## 2013-08-02 NOTE — Addendum Note (Signed)
Addended by: Nicanor AlconNAGLE, Josephine Rudnick K on: 08/02/2013 02:45 PM   Modules accepted: Orders

## 2013-08-02 NOTE — Patient Instructions (Signed)
Doxcycline 100mg  Twice daily  For 7 days -  Mucinex DM Twice daily  As needed  Cough/congestion  Prednisone taper over next week.  Chest xray today  Please contact office for sooner follow up if symptoms do not improve or worsen or seek emergency care  Follow up Dr. Delton CoombesByrum  As planned and As needed

## 2013-08-02 NOTE — Assessment & Plan Note (Signed)
Flare  CXR today   Plan  Doxcycline 100mg  Twice daily  For 7 days -  Mucinex DM Twice daily  As needed  Cough/congestion  Prednisone taper over next week.  Chest xray today  Please contact office for sooner follow up if symptoms do not improve or worsen or seek emergency care  Follow up Dr. Delton CoombesByrum  As planned and As needed

## 2013-08-02 NOTE — Progress Notes (Signed)
  Subjective:    Patient ID: [name], male    DOB: [birth year], 58 y.o.   MRN: [id number]  HPI 58 yo former smoker, 35 pk-yrs, with documented COPD. Followed by Dr Maxwell Caul and most recent FEV1 profoundly low at 0.54L (18% pred). On spiriva + symbicort 80. I met him originally in 2013 when he was admitted for an AE-COPD that required BiPAP. He is referred today to assess his meds and to discuss possible transplant referral at some point.    Functional capacity - he does have some limitations, no longer able to run, tough to swim in the ocean. He does stair w some difficulty. He is able to work. Has to rest some times.  Coughs occasionally, white mucous. Rare wheeze. Snores. No witnessed apneas. Uses SABA 2x week. Single AE since 2013.   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 Positive BD response, hyperinflation, decreased diffusion. We increased Symbicort to 160, continued spiriva. He has O2 at home that he uses at night, has started using using a bit with exertion but not reliably.    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 His a1-AT genotype was normal variant MZpratt, with a normal level 149. He is not smoking. Feels well. No flares since last time. Uses albuterol . Uses O2 at night, with some exertion.  > no change   08/02/13 Acute OV  Complains of 1 week of increased cough , dyspnea and wheezing . More DOE with walking.  Patient denies any hemoptysis, orthopnea, PND, or leg swelling. Appetite is good. No nausea, or vomiting.  Patient denies any fever.  Has been coughing up yellow stuff for the last week.   Review of Systems  Constitutional: Negative for fever and unexpected weight change.  HENT: Negative for congestion, dental problem, ear pain, nosebleeds, postnasal drip, rhinorrhea, sinus pressure, sneezing, sore throat and trouble swallowing.   Eyes: Negative for redness and itching.   Respiratory: Positive for cough and shortness of breath. Negative for chest tightness and wheezing.   Cardiovascular: Negative for palpitations and leg swelling.  Gastrointestinal: Negative for nausea and vomiting.  Genitourinary: Negative for dysuria.  Musculoskeletal: Negative for joint swelling.  Skin: Negative for rash.  Neurological: Negative for headaches.  Hematological: Does not bruise/bleed easily.  Psychiatric/Behavioral: Negative for dysphoric mood. The patient is nervous/anxious.        Objective:   Physical Exam  Gen: Pleasant, thin male , in no distress,  normal affect  ENT: No lesions,  mouth clear,  oropharynx clear, no postnasal drip  Neck: No JVD, no TMG, no carotid bruits  Lungs: No use of accessory muscles,   diminished BS in bases , faint exp wheeze  No stridor   Cardiovascular: RRR, heart sounds normal, no murmur or gallops, no peripheral edema  Musculoskeletal: No deformities, no cyanosis or clubbing  Neuro: alert, non focal  Skin: Warm, no lesions or rashes     Assessment & Plan:

## 2013-08-04 ENCOUNTER — Telehealth: Payer: Self-pay | Admitting: Adult Health

## 2013-08-04 ENCOUNTER — Encounter: Payer: Self-pay | Admitting: *Deleted

## 2013-08-04 NOTE — Telephone Encounter (Signed)
Letter has been faxed. Pt aware. Nothing further needed

## 2013-08-04 NOTE — Telephone Encounter (Signed)
That is fine 

## 2013-08-04 NOTE — Telephone Encounter (Signed)
Notes Recorded by Julio Sicksammy S Parrett, NP on 08/02/2013 at 5:45 PM cxr is w/ no acute changes Cont w/ ov recs  Please contact office for sooner follow up if symptoms do not improve or worsen or seek emergency care  --  I spoke with patient about results and he verbalized understanding. Pt reports he stayed out of work yesterday and today. He is wanting a work note for this. If so please fax to [phone] attn: Seward GraterMaggie or Thayer Ohmhris. Please advise TP thanks

## 2013-09-21 ENCOUNTER — Telehealth: Payer: Self-pay | Admitting: Emergency Medicine

## 2013-09-21 MED ORDER — TIOTROPIUM BROMIDE MONOHYDRATE 18 MCG IN CAPS: 18.0000 ug | ORAL_CAPSULE | Freq: Every day | RESPIRATORY_TRACT | Status: DC

## 2013-09-21 MED ORDER — BUDESONIDE-FORMOTEROL FUMARATE 160-4.5 MCG/ACT IN AERO: 2.0000 | INHALATION_SPRAY | Freq: Two times a day (BID) | RESPIRATORY_TRACT | Status: DC

## 2013-09-21 NOTE — Telephone Encounter (Signed)
Spoke with pt and advised that rx request for symbicort and [facility]

## 2013-10-05 ENCOUNTER — Telehealth: Payer: Self-pay | Admitting: Emergency Medicine

## 2013-10-05 NOTE — Telephone Encounter (Signed)
Called [facility] and gave VO for Spiriva  Left detailed msg for the pt  Will close encounter

## 2014-01-03 ENCOUNTER — Telehealth: Payer: Self-pay | Admitting: Emergency Medicine

## 2014-01-03 MED ORDER — BUDESONIDE-FORMOTEROL FUMARATE 160-4.5 MCG/ACT IN AERO: 2.0000 | INHALATION_SPRAY | Freq: Two times a day (BID) | RESPIRATORY_TRACT | Status: DC

## 2014-01-03 NOTE — Telephone Encounter (Signed)
lmomtcb x1 for pt symbicort sent for [facility] day supply [facility]

## 2014-01-03 NOTE — Telephone Encounter (Signed)
Spoke with pt and advised that rx has been sent.

## 2014-01-12 ENCOUNTER — Telehealth: Payer: Self-pay

## 2014-01-12 ENCOUNTER — Telehealth: Payer: Self-pay | Admitting: Emergency Medicine

## 2014-01-12 ENCOUNTER — Encounter: Payer: Self-pay | Admitting: Emergency Medicine

## 2014-01-12 ENCOUNTER — Ambulatory Visit (INDEPENDENT_AMBULATORY_CARE_PROVIDER_SITE_OTHER): Payer: Managed Care, Other (non HMO) | Admitting: Emergency Medicine

## 2014-01-12 VITALS — BP 126/70 | HR 89 | Ht 63.0 in | Wt 117.0 lb

## 2014-01-12 DIAGNOSIS — J449 Chronic obstructive pulmonary disease, unspecified: Secondary | ICD-10-CM

## 2014-01-12 NOTE — Progress Notes (Signed)
  Subjective:    Patient ID: [name], male    DOB: 09-Aug-1955, 58 y.o.   MRN: [id number]  HPI 57 yo former smoker, 35 pk-yrs, with documented COPD. Followed by Dr Maxwell Caul and most recent FEV1 profoundly low at 0.54L (18% pred). On spiriva + symbicort 80. I met him originally in 2013 when he was admitted for an AE-COPD that required BiPAP. He is referred today to assess his meds and to discuss possible transplant referral at some point.    Functional capacity - he does have some limitations, no longer able to run, tough to swim in the ocean. He does stair w some difficulty. He is able to work. Has to rest some times.  Coughs occasionally, white mucous. Rare wheeze. Snores. No witnessed apneas. Uses SABA 2x week. Single AE since 2013.   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 Positive BD response, hyperinflation, decreased diffusion. We increased Symbicort to 160, continued spiriva. He has O2 at home that he uses at night, has started using using a bit with exertion but not reliably.    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 His a1-AT genotype was normal variant MZpratt, with a normal level 149. He is not smoking. Feels well. No flares since last time. Uses albuterol . Uses O2 at night, with some exertion.  > no change   08/02/13 Acute OV  Complains of 1 week of increased cough , dyspnea and wheezing . More DOE with walking.  Patient denies any hemoptysis, orthopnea, PND, or leg swelling. Appetite is good. No nausea, or vomiting.  Patient denies any fever.  Has been coughing up yellow stuff for the last week.  ROV 01/12/14 - hx severe COPD, here for f/u. He has been doing fairly well since last visit when he was treated for an AE. He is doing Symbicort and Spiriva.  Uses albuterol most days.       Objective:   Physical Exam Filed Vitals:   01/12/14 1051  BP: 126/70  Pulse: 89  Height: 5'  3" (1.6 m)  Weight: 117 lb (53.071 kg)  SpO2: 96%    Gen: Pleasant, thin male , in no distress,  normal affect  ENT: No lesions,  mouth clear,  oropharynx clear, no postnasal drip  Neck: No JVD, no TMG, no carotid bruits  Lungs: No use of accessory muscles,   diminished BS in bases , faint exp wheeze  No stridor   Cardiovascular: RRR, heart sounds normal, no murmur or gallops, no peripheral edema  Musculoskeletal: No deformities, no cyanosis or clubbing  Neuro: alert, non focal  Skin: Warm, no lesions or rashes     Assessment & Plan:   COPD (chronic obstructive pulmonary disease) Doing fairly well. May have found that he benefits more from Advair. Currently on Symbicort + Spiriva. No flares since 3/'15. Will consider a change to Advair in the future is he would like to try.  We need to check with Dr Johnston Ebbs about when he had the Prevnar-13

## 2014-01-12 NOTE — Telephone Encounter (Signed)
LMOMTCB x 1 

## 2014-01-12 NOTE — Assessment & Plan Note (Signed)
Doing fairly well. May have found that he benefits more from Advair. Currently on Symbicort + Spiriva. No flares since 3/'15. Will consider a change to Advair in the future is he would like to try.  We need to check with Dr Azell Der about when he had the Prevnar-13

## 2014-01-12 NOTE — Patient Instructions (Signed)
Please continue your current medications and oxygen as you have been using them  Get your Flu shot this Fall Restart your loratadine this Fall if your allergies flare Follow with Dr Delton Coombes in 4 months or sooner if you have any problems.

## 2014-01-12 NOTE — Telephone Encounter (Signed)
Called and left a vm for Dr. Fayrene Fearing Osborne's office to see if this patient has had his Prevnar-13 vaccine at the request of RB.

## 2014-01-13 NOTE — Telephone Encounter (Signed)
LMTCBx1.Hardie Veltre, CMA  

## 2014-01-13 NOTE — Telephone Encounter (Signed)
Thank you - he will need to get it next visit

## 2014-01-13 NOTE — Telephone Encounter (Signed)
Spoke with [place], Dr. Newell Coral CMA.    06/07/2011-pneumovax Has not had prevnar.  Dr. Delton Coombes, just an FYI.

## 2014-01-14 NOTE — Telephone Encounter (Signed)
Called and spoke with pt and he forgot to ask RB a few questions:  1.  Is he eligible for disability? 2.  What is the process for filing for this and can he start this process.    RB please advise thanks

## 2014-01-18 NOTE — Telephone Encounter (Signed)
He may be eligible, although I don't know how his breathing impacts his work. The process is to go to human resources at his work, get paperwork to complete regarding application for disability. The paperwork then goes to [facility] here.

## 2014-01-18 NOTE — Telephone Encounter (Signed)
lmomtcb x1 

## 2014-01-19 NOTE — Telephone Encounter (Signed)
Called spoke with pt. Aware of below. Nothing further needed 

## 2014-01-19 NOTE — Telephone Encounter (Signed)
Pt has returned call - his # [phone].

## 2014-01-19 NOTE — Telephone Encounter (Signed)
lmomtcb for pt 

## 2014-03-18 ENCOUNTER — Telehealth: Payer: Self-pay | Admitting: Emergency Medicine

## 2014-03-18 MED ORDER — PREDNISONE 10 MG PO TABS: ORAL_TABLET | ORAL | Status: DC

## 2014-03-18 MED ORDER — DOXYCYCLINE HYCLATE 100 MG PO TABS: 100.0000 mg | ORAL_TABLET | Freq: Two times a day (BID) | ORAL | Status: DC

## 2014-03-18 NOTE — Telephone Encounter (Signed)
Spoke with the pt  He is c/o increased cough x 2 days- sputum is yellow  He has noticed minimal wheezing  He states no fever, increased SOB, CP or chest tightness No appts open today with minimal providers in clinic  Please advise thanks! Allergies  Allergen Reactions  . Other Other (See Comments)    Patient claims he has a childhood allergy to "mycins."

## 2014-03-18 NOTE — Telephone Encounter (Signed)
Spoke with RB Rxs for doxy and pred taper sent per his request  Called the pt and notified him that this was done

## 2014-05-17 ENCOUNTER — Telehealth: Payer: Self-pay | Admitting: Emergency Medicine

## 2014-05-17 NOTE — Telephone Encounter (Signed)
LMTCB

## 2014-05-18 ENCOUNTER — Other Ambulatory Visit: Payer: Self-pay | Admitting: *Deleted

## 2014-05-18 MED ORDER — ALBUTEROL SULFATE HFA 108 (90 BASE) MCG/ACT IN AERS: 1.0000 | INHALATION_SPRAY | Freq: Four times a day (QID) | RESPIRATORY_TRACT | Status: DC | PRN

## 2014-05-18 NOTE — Telephone Encounter (Signed)
His insurance will no longer cover the symbicort but will cover the advair.  He stated that the proair will also need to be sent in to the [facility] home delivery pharmacy.  RB please advise on the dose of advair for the pt.  Thanks  Allergies  Allergen Reactions  . Other Other (See Comments)    Patient claims he has a childhood allergy to "mycins."    Current Outpatient Prescriptions on File Prior to Visit  Medication Sig Dispense Refill  . aspirin 81 MG tablet Take 81 mg by mouth daily.    . budesonide-formoterol (SYMBICORT) 160-4.5 MCG/ACT inhaler Inhale 2 puffs into the lungs 2 (two) times daily. Rinse mouth out after each use 3 Inhaler 3  . doxycycline (VIBRA-TABS) 100 MG tablet Take 1 tablet (100 mg total) by mouth 2 (two) times daily. 14 tablet 0  . predniSONE (DELTASONE) 10 MG tablet 4 x 2 days, 3 x 2 days, 2 x 2 days, 1 x 2 days, then stop 20 tablet 0  . sertraline (ZOLOFT) 50 MG tablet Take 50 mg by mouth daily.    Marland Kitchen. tiotropium (SPIRIVA) 18 MCG inhalation capsule Place 1 capsule (18 mcg total) into inhaler and inhale daily. 90 capsule 3   No current facility-administered medications on file prior to visit.

## 2014-05-18 NOTE — Telephone Encounter (Signed)
Can we check to see if [facility] is also an option? If so, I would [facility]. If Advair is the only option then would order 250 bid

## 2014-05-19 MED ORDER — FLUTICASONE-SALMETEROL 250-50 MCG/DOSE IN AEPB: 1.0000 | INHALATION_SPRAY | Freq: Two times a day (BID) | RESPIRATORY_TRACT | Status: DC

## 2014-05-19 NOTE — Telephone Encounter (Signed)
Called and spoke with pt and he stated that he will check with the insurance company about the dulera and he will call me back to let us know.

## 2014-05-19 NOTE — Telephone Encounter (Signed)
Called and spoke to pt. [facility]. Informed pt of the alternative: advair, 1 puff BID and rinse mouth out after each use. Pt stated he is aware of how to use advair as he has done this in the past. Rx sent to preferred pharmacy. Pt verbalized understanding and denied any further questions or concerns at this time.   Will send to RB as FYI. Will sign off.

## 2014-05-19 NOTE — Telephone Encounter (Signed)
Pt returned call - [phone]

## 2014-07-21 ENCOUNTER — Ambulatory Visit (INDEPENDENT_AMBULATORY_CARE_PROVIDER_SITE_OTHER): Payer: Managed Care, Other (non HMO) | Admitting: Emergency Medicine

## 2014-07-21 ENCOUNTER — Encounter: Payer: Self-pay | Admitting: Emergency Medicine

## 2014-07-21 VITALS — BP 134/68 | HR 112 | Ht 63.0 in | Wt 118.0 lb

## 2014-07-21 DIAGNOSIS — J449 Chronic obstructive pulmonary disease, unspecified: Secondary | ICD-10-CM

## 2014-07-21 NOTE — Assessment & Plan Note (Signed)
Please continue your inhaled medications as you have been taking them  We will perform a walking oximetry today on 3L/min to see if this is adequate.  Follow with Dr Delton CoombesByrum in 3 months or sooner if you have any problems

## 2014-07-21 NOTE — Patient Instructions (Signed)
Please continue your inhaled medications as you have been taking them  We will perform a walking oximetry today on 3L/min to see if this is adequate.  Follow with Dr Byrum in 3 months or sooner if you have any problems  

## 2014-07-21 NOTE — Progress Notes (Signed)
Subjective:    Patient ID: [name], male    DOB: 08-08-1955, 59 y.o.   MRN: [id number]  HPI 59 yo former smoker, 35 pk-yrs, with documented COPD. Followed by Dr Maxwell Caul and most recent FEV1 profoundly low at 0.54L (18% pred). On spiriva + symbicort 80. I met him originally in 2013 when he was admitted for an AE-COPD that required BiPAP. He is referred today to assess his meds and to discuss possible transplant referral at some point.    Functional capacity - he does have some limitations, no longer able to run, tough to swim in the ocean. He does stair w some difficulty. He is able to work. Has to rest some times.  Coughs occasionally, white mucous. Rare wheeze. Snores. No witnessed apneas. Uses SABA 2x week. Single AE since 2013.   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 Positive BD response, hyperinflation, decreased diffusion. We increased Symbicort to 160, continued spiriva. He has O2 at home that he uses at night, has started using using a bit with exertion but not reliably.    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 His a1-AT genotype was normal variant MZpratt, with a normal level 149. He is not smoking. Feels well. No flares since last time. Uses albuterol . Uses O2 at night, with some exertion.  > no change   08/02/13 Acute OV  Complains of 1 week of increased cough , dyspnea and wheezing . More DOE with walking.  Patient denies any hemoptysis, orthopnea, PND, or leg swelling. Appetite is good. No nausea, or vomiting.  Patient denies any fever.  Has been coughing up yellow stuff for the last week.  ROV 01/12/14 - hx severe COPD, here for f/u. He has been doing fairly well since last visit when he was treated for an AE. He is doing Symbicort and Spiriva.  Uses albuterol most days.   ROV 07/21/14 -- follow up for severe COPD, hypoxemia, last seen here in August. He has been up and down,  doing fairly well but is using O2 all the time now. He is having a bit more exertional SOB. We changed him to Advair from Symbicort for insurance reasons. He is using his albuterol daily (an increase). He is coughing some, not frequently productive except in the morning > white. Last AE was end of October.       Objective:   Physical Exam Filed Vitals:   07/21/14 1424  BP: 134/68  Pulse: 112  Height: _0  (1.6 m)  Weight: 118 lb (53.524 kg)  SpO2: 94%    Gen: Pleasant, thin male , in no distress,  normal affect  ENT: No lesions,  mouth clear,  oropharynx clear, no postnasal drip  Neck: No JVD, no TMG, no carotid bruits  Lungs: No use of accessory muscles,   diminished BS in bases , faint exp wheeze  No stridor   Cardiovascular: RRR, heart sounds normal, no murmur or gallops, no peripheral edema  Musculoskeletal: No deformities, no cyanosis or clubbing  Neuro: alert, non focal  Skin: Warm, no lesions or rashes     Assessment & Plan:   COPD (chronic obstructive pulmonary disease) Please continue your inhaled medications as you have been taking them  We will perform a walking oximetry today on 3L/min to see if this is adequate.  Follow with Dr Lamonte Sakai in 3  months or sooner if you have any problems

## 2014-08-04 ENCOUNTER — Telehealth: Payer: Self-pay | Admitting: Emergency Medicine

## 2014-08-04 NOTE — Telephone Encounter (Signed)
Spoke with April at [facility]  She states that the pt has [facility], and [facility] is not in contract with them and therefore can not provide pt with POC  Will [facility] to [facility] to work on getting order sent to different DME

## 2014-08-05 NOTE — Telephone Encounter (Signed)
[email or web address] is checking into this for me Tobe SosSally E Ottinger

## 2014-08-09 NOTE — Telephone Encounter (Signed)
Pt's poc has been ordered through [facility] and he should be getting it this week  [name]

## 2014-08-09 NOTE — Telephone Encounter (Signed)
What's the status of this? Thanks! 

## 2014-09-12 ENCOUNTER — Ambulatory Visit (INDEPENDENT_AMBULATORY_CARE_PROVIDER_SITE_OTHER): Payer: Managed Care, Other (non HMO) | Admitting: Pulmonary Disease

## 2014-09-12 ENCOUNTER — Ambulatory Visit (INDEPENDENT_AMBULATORY_CARE_PROVIDER_SITE_OTHER)
Admission: RE | Admit: 2014-09-12 | Discharge: 2014-09-12 | Disposition: A | Discharge status: Home / Self Care | Payer: Managed Care, Other (non HMO) | Source: Ambulatory Visit | Attending: Pulmonary Disease | Admitting: Pulmonary Disease

## 2014-09-12 ENCOUNTER — Telehealth: Payer: Self-pay | Admitting: Emergency Medicine

## 2014-09-12 ENCOUNTER — Encounter: Payer: Self-pay | Admitting: Pulmonary Disease

## 2014-09-12 DIAGNOSIS — J418 Mixed simple and mucopurulent chronic bronchitis: Secondary | ICD-10-CM | POA: Diagnosis not present

## 2014-09-12 DIAGNOSIS — J9611 Chronic respiratory failure with hypoxia: Secondary | ICD-10-CM | POA: Diagnosis not present

## 2014-09-12 MED ORDER — LEVOFLOXACIN 750 MG PO TABS: 750.0000 mg | ORAL_TABLET | Freq: Every day | ORAL | Status: DC

## 2014-09-12 MED ORDER — PREDNISONE 20 MG PO TABS: ORAL_TABLET | ORAL | Status: DC

## 2014-09-12 MED ORDER — METHYLPREDNISOLONE ACETATE 80 MG/ML IJ SUSP
80.0000 mg | Freq: Once | INTRAMUSCULAR | Status: AC
  Administered 2014-09-12: 80 mg via INTRAMUSCULAR

## 2014-09-12 NOTE — Patient Instructions (Addendum)
Today we updated your med list in our EPIC system...    Continue your current medications the same...  Today we checked a follow up CXR...    We will contact you w/ the results when available...   For your acute exacerbation>>    Continue your ADVAIR & SPIRIVA the same...  We wrote for an antibiotic- LEVAQUIN 500mg  take one tab daily til gone...  We also wrote for a tapering course of PREDNISONE 20mg  tabs>    Start w/ one tab twice daily for 5 days...    Then decrease to 1 tab each AM for 5 days...    Then decrease to 1/2 tab daily for 5 days...    Then decrease to 1/2 tab every other day til gone (1/2, 0, 1/2, 0, etc)...  You shouls plan a follow up w/ DrByrum in 2-3 wks.Marland Kitchen..Marland Kitchen
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
## 2014-09-12 NOTE — Telephone Encounter (Signed)
Spoke with pt. Reports having issues with breathing. OV has been made with SN for this AM. Wants to be working in with RB this week. Advised that RB is fully booked this week and he should keep OV with SN. He agreed and verbalized understanding. Nothing further was needed.

## 2014-09-26 ENCOUNTER — Telehealth: Payer: Self-pay | Admitting: Emergency Medicine

## 2014-09-26 ENCOUNTER — Other Ambulatory Visit: Payer: Self-pay | Admitting: Emergency Medicine

## 2014-09-26 NOTE — Telephone Encounter (Signed)
I discussed pt's work with him, his job requirements, his medical limits. Based on our conversation and his medical condition I do not believe that he can go back to work or that there is a more limited job that he could take on. I will fill out the form for him to be out indefinitely, possibly permanently.

## 2014-09-26 NOTE — Telephone Encounter (Signed)
Dr. B do you have these forms already, or do you need these forms resent to you?

## 2014-09-28 ENCOUNTER — Ambulatory Visit (INDEPENDENT_AMBULATORY_CARE_PROVIDER_SITE_OTHER): Payer: Managed Care, Other (non HMO) | Admitting: Emergency Medicine

## 2014-09-28 ENCOUNTER — Encounter: Payer: Self-pay | Admitting: Emergency Medicine

## 2014-09-28 VITALS — BP 150/90 | HR 124 | Wt 113.2 lb

## 2014-09-28 DIAGNOSIS — J418 Mixed simple and mucopurulent chronic bronchitis: Secondary | ICD-10-CM

## 2014-09-28 NOTE — Assessment & Plan Note (Signed)
[name] has improved to some degree since his flareup of COPD but he is clearly very limited. He has an oxygen requirement of 4-5 L/m. I do not believe he can return to work at this time and I explained to him that he may not be able to do so at any time the future. I filled out his disability forms for short-term disability only at his request. Over 80% percent of this 30 minute visit was spent in counseling.

## 2014-09-28 NOTE — Progress Notes (Signed)
Subjective:    Patient ID: [name], male    DOB: [birth year], 59 y.o.   MRN: [id number]  HPI 59 yo former smoker, 35 pk-yrs, with documented COPD. Followed by Dr Maxwell Caul and most recent FEV1 profoundly low at 0.54L (18% pred). On spiriva + symbicort 80. I met him originally in 2013 when he was admitted for an AE-COPD that required BiPAP. He is referred today to assess his meds and to discuss possible transplant referral at some point.    Functional capacity - he does have some limitations, no longer able to run, tough to swim in the ocean. He does stair w some difficulty. He is able to work. Has to rest some times.  Coughs occasionally, white mucous. Rare wheeze. Snores. No witnessed apneas. Uses SABA 2x week. Single AE since 2013.   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 Positive BD response, hyperinflation, decreased diffusion. We increased Symbicort to 160, continued spiriva. He has O2 at home that he uses at night, has started using using a bit with exertion but not reliably.    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 His a1-AT genotype was normal variant MZpratt, with a normal level 149. He is not smoking. Feels well. No flares since last time. Uses albuterol . Uses O2 at night, with some exertion.  > no change   08/02/13 Acute OV  Complains of 1 week of increased cough , dyspnea and wheezing . More DOE with walking.  Patient denies any hemoptysis, orthopnea, PND, or leg swelling. Appetite is good. No nausea, or vomiting.  Patient denies any fever.  Has been coughing up yellow stuff for the last week.  ROV 01/12/14 - hx severe COPD, here for f/u. He has been doing fairly well since last visit when he was treated for an AE. He is doing Symbicort and Spiriva.  Uses albuterol most days.   ROV 07/21/14 -- follow up for severe COPD, hypoxemia, last seen here in August. He has been up and down,  doing fairly well but is using O2 all the time now. He is having a bit more exertional SOB. We changed him to Advair from Symbicort for insurance reasons. He is using his albuterol daily (an increase). He is coughing some, not frequently productive except in the morning > white. Last AE was end of October.   ROV 09/28/14 -- follow-up visit for severe COPD, hypoxemic respiratory failure. Seen by Dr. Lenna Gilford  09/12/14 with an acute exacerbation that required steroids and antibiotics and that kept him out of work. It has since become clear that he may not be able to return to work due to his breathing limitations. We have filled paperwork for short-term disability. He has become much more limited with his activity. He is currently on Advair, Spiriva, albuterol prn.      Objective:   Physical Exam Filed Vitals:   09/28/14 1647  BP: 150/90  Pulse: 124  Weight: 113 lb 3.2 oz (51.347 kg)  SpO2: 90%    Gen: Pleasant, thin male , in no distress,  normal affect  ENT: No lesions,  mouth clear,  oropharynx clear, no postnasal drip  Neck: No JVD, no TMG, no carotid bruits  Lungs: No use of accessory muscles,   diminished BS in bases , faint exp wheeze  No stridor   Cardiovascular: RRR, heart sounds normal, no  murmur or gallops, no peripheral edema  Musculoskeletal: No deformities, no cyanosis or clubbing  Neuro: alert, non focal  Skin: Warm, no lesions or rashes     Assessment & Plan:   COPD (chronic obstructive pulmonary disease) [name] has improved to some degree since his flareup of COPD but he is clearly very limited. He has an oxygen requirement of 4-5 L/m. I do not believe he can return to work at this time and I explained to him that he may not be able to do so at any time the future. I filled out his disability forms for short-term disability only at his request. Over 80% percent of this 30 minute visit was spent in counseling.

## 2014-09-28 NOTE — Patient Instructions (Signed)
Please continue your current inhaled medications, Advair, Spiriva and albuterol, as you have been taking them  Wear your oxygen at all times at rest on 4L/min pulsed. You may increase to 5L/min with walking or exertion.  We have arranged for short term disability. You need to be prepared for the possibility that you cannot safely return to work and that we will need to arrange for long term disability.  Follow with Dr Delton CoombesByrum in 1 month

## 2014-11-03 ENCOUNTER — Encounter: Payer: Self-pay | Admitting: Emergency Medicine

## 2014-11-03 ENCOUNTER — Ambulatory Visit (INDEPENDENT_AMBULATORY_CARE_PROVIDER_SITE_OTHER): Payer: Managed Care, Other (non HMO) | Admitting: Emergency Medicine

## 2014-11-03 VITALS — BP 142/100 | HR 121 | Ht 63.0 in | Wt 112.0 lb

## 2014-11-03 DIAGNOSIS — J418 Mixed simple and mucopurulent chronic bronchitis: Secondary | ICD-10-CM | POA: Diagnosis not present

## 2014-11-03 NOTE — Patient Instructions (Signed)
Please continue your current inhaled medications as you are taking them Continue oxygen at 4-5 L/m Your [facility] vocational rehabilitation services paperwork has been completed. At this time you cannot return to work even on a limited scale We will follow-up in 3 months to assess any progress. At that time we will decide whether you can come off short-term disability or whether we need to pursue long-term disability.

## 2014-11-03 NOTE — Progress Notes (Addendum)
Subjective:    Patient ID: [name], male    DOB: [birth year], 59 y.o.   MRN: [id number]  HPI 59 yo former smoker, 35 pk-yrs, with documented COPD. Followed by Dr Maxwell Caul and most recent FEV1 profoundly low at 0.54L (18% pred). On spiriva + symbicort 80. I met him originally in 2013 when he was admitted for an AE-COPD that required BiPAP. He is referred today to assess his meds and to discuss possible transplant referral at some point.    Functional capacity - he does have some limitations, no longer able to run, tough to swim in the ocean. He does stair w some difficulty. He is able to work. Has to rest some times.  Coughs occasionally, white mucous. Rare wheeze. Snores. No witnessed apneas. Uses SABA 2x week. Single AE since 2013.   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 Positive BD response, hyperinflation, decreased diffusion. We increased Symbicort to 160, continued spiriva. He has O2 at home that he uses at night, has started using using a bit with exertion but not reliably.    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 His a1-AT genotype was normal variant MZpratt, with a normal level 149. He is not smoking. Feels well. No flares since last time. Uses albuterol . Uses O2 at night, with some exertion.  > no change   08/02/13 Acute OV  Complains of 1 week of increased cough , dyspnea and wheezing . More DOE with walking.  Patient denies any hemoptysis, orthopnea, PND, or leg swelling. Appetite is good. No nausea, or vomiting.  Patient denies any fever.  Has been coughing up yellow stuff for the last week.  ROV 01/12/14 - hx severe COPD, here for f/u. He has been doing fairly well since last visit when he was treated for an AE. He is doing Symbicort and Spiriva.  Uses albuterol most days.   ROV 07/21/14 -- follow up for severe COPD, hypoxemia, last seen here in August. He has been up and down,  doing fairly well but is using O2 all the time now. He is having a bit more exertional SOB. We changed him to Advair from Symbicort for insurance reasons. He is using his albuterol daily (an increase). He is coughing some, not frequently productive except in the morning > white. Last AE was end of October.   ROV 09/28/14 -- follow-up visit for severe COPD, hypoxemic respiratory failure. Seen by Dr. Lenna Gilford  09/12/14 with an acute exacerbation that required steroids and antibiotics and that kept him out of work. It has since become clear that he may not be able to return to work due to his breathing limitations. We have filled paperwork for short-term disability. He has become much more limited with his activity. He is currently on Advair, Spiriva, albuterol prn.   ROV 11/03/14 -- follow up for severe COPD and chronic resp failure. He is on Advair, Spiriva, O2 at 4-5L/min. His lung disease has become progressively limiting and he is now on short-term disability.  He is hoping to find alternative work through the [employer]. There is paperwork for [place] to fill to define the hours that he can work and the kind of exertion he can tolerate. He is currently unable to do ADL's, cleaning, chores. He has to stop to rest after walking anywhere from 25-100 feet. It is  currently compensated state he is severely limited. Do not believe he'll be able return to work although he still has some hope for this     Objective:   Physical Exam Filed Vitals:   11/03/14 1356  BP: 142/100  Pulse: 121  Height: 5' 3"  (1.6 m)  Weight: 112 lb (50.803 kg)  SpO2: 95%    Gen: Pleasant, thin male , in no distress,  normal affect  ENT: No lesions,  mouth clear,  oropharynx clear, no postnasal drip  Neck: No JVD, no TMG, no carotid bruits  Lungs: No use of accessory muscles,   diminished BS in bases , very distant No stridor   Cardiovascular: RRR, heart sounds normal, no murmur or gallops,  no peripheral edema  Musculoskeletal: No deformities, no cyanosis or clubbing  Neuro: alert, non focal  Skin: Warm, no lesions or rashes     Assessment & Plan:   COPD (chronic obstructive pulmonary disease) Very severe COPD that I would characterize as GOLD class D disease at this point. He has maximized his functionality but is still very limited. I believe he cannot return to work even in the limited role at this time. I suspect that he will need permanent disability but he does hold out some hope that after his appropriate short-term disability he will be able to find an alternative place to work. I will continue his current bronchodilators and oxygen. I have completed his [employer] vocational rehabilitation services paperwork. I will forward this note to [employer] to continue his short-term disability until I see him again in 3 months.

## 2014-11-03 NOTE — Assessment & Plan Note (Addendum)
Very severe COPD that I would characterize as GOLD class D disease at this point. He has maximized his functionality but is still very limited. I believe he cannot return to work even in the limited role at this time. I suspect that he will need permanent disability but he does hold out some hope that after his appropriate short-term disability he will be able to find an alternative place to work. I will continue his current bronchodilators and oxygen. I have completed his [employer] vocational rehabilitation services paperwork. I will forward this note to [employer] to continue his short-term disability until I see him again in 3 months.

## 2015-01-24 ENCOUNTER — Encounter: Payer: Self-pay | Admitting: Emergency Medicine

## 2015-01-24 ENCOUNTER — Telehealth: Payer: Self-pay | Admitting: Emergency Medicine

## 2015-01-24 ENCOUNTER — Ambulatory Visit (INDEPENDENT_AMBULATORY_CARE_PROVIDER_SITE_OTHER): Payer: Managed Care, Other (non HMO) | Admitting: Emergency Medicine

## 2015-01-24 VITALS — BP 142/96 | HR 111 | Ht 63.0 in | Wt 110.4 lb

## 2015-01-24 DIAGNOSIS — J418 Mixed simple and mucopurulent chronic bronchitis: Secondary | ICD-10-CM | POA: Diagnosis not present
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
## 2015-01-24 NOTE — Patient Instructions (Signed)
Please continue your current medications as you have been taking them  Continue your oxygen at all times.  We will need to let [facility] know that you are qualified for permanent disability.  Follow with Dr Delton Coombes in 4 months or sooner if you have any problems.

## 2015-01-24 NOTE — Telephone Encounter (Signed)
lmtcb X1 for [employer].

## 2015-01-24 NOTE — Assessment & Plan Note (Signed)
Severe disease. No exacerbations. He should will qualify for long-term disability. He needs paperwork filled out to complete his short-term disability in the interim then I'm happy to do this. We will continue his current medicines  Please continue your current medications as you have been taking them  Continue your oxygen at all times.  We will need to let [facility] know that you are qualified for permanent disability.  Follow with Dr Delton Coombes in 4 months or sooner if you have any problems.

## 2015-01-25 NOTE — Telephone Encounter (Signed)
lmtcb for rep.

## 2015-01-25 NOTE — Telephone Encounter (Signed)
[employer] rep called back and stated that they spoke with the pt and the pt is requesting that the OV note from RB will need to be faxed to them at the number above.  Will forward this message to RB to make him aware.   Allergies  Allergen Reactions  . Other Other (See Comments)    Patient claims he has a childhood allergy to "mycins."    , Current Outpatient Prescriptions on File Prior to Visit  Medication Sig Dispense Refill  . aspirin 81 MG tablet Take 81 mg by mouth daily.    . Fluticasone-Salmeterol (ADVAIR) 250-50 MCG/DOSE AEPB Inhale 1 puff into the lungs every 12 (twelve) hours. 3 each 3  . PROAIR HFA 108 (90 BASE) MCG/ACT inhaler INHALE 1 TO 2 PUFFS ORALLY INTO THE LUNGS EVERY SIX HOURS AS NEEDED FOR WHEEZING OR SHORTNESS OF BREATH 3 Inhaler 1  . sertraline (ZOLOFT) 50 MG tablet Take 50 mg by mouth daily.    Marland Kitchen SPIRIVA HANDIHALER 18 MCG inhalation capsule INHALE CONTENTS OF 1 CAPSULE THROUGH HANDIHALER DEVICE ONE TIME DAILY 90 capsule 1   No current facility-administered medications on file prior to visit.

## 2015-01-25 NOTE — Telephone Encounter (Signed)
Thank you please send to them

## 2015-01-25 NOTE — Telephone Encounter (Signed)
Dr Kavin Leech office note printed and faxed to [phone]  Nothing further is needed at this time.

## 2015-02-01 ENCOUNTER — Telehealth: Payer: Self-pay | Admitting: Emergency Medicine

## 2015-02-01 NOTE — Telephone Encounter (Signed)
Called and spoke with pt Pt stated that he contacted [employer] this morning and they stated they had not received Dr Kavin Leech most recent office note Note was requested 01/25/2015  Informed pt that per records, note was faxed on 01/25/15 to fax # given Verified fax number Informed pt that note would be refaxed  Note refaxed to [phone]  Nothing further is needed

## 2015-03-03 ENCOUNTER — Telehealth: Payer: Self-pay | Admitting: Emergency Medicine

## 2015-03-03 NOTE — Telephone Encounter (Signed)
Papers sent through interoffice mail to [facility] 3rd floor, [employer]. Put note on papers advising them that papers need to be processed before 03/13/15.   Advised LL of papers and of deadline prior to sending to [employer]. Also, contacted pt to confirm date due.  Pt confirmed it was due on 03/13/15.  Pt advised that paperwork has been sent to [employer] via interoffice mail and that our office would contact him when the papers have been completed.  To LL for follow up

## 2015-03-05 ENCOUNTER — Other Ambulatory Visit: Payer: Self-pay | Admitting: Emergency Medicine

## 2015-03-10 ENCOUNTER — Telehealth: Payer: Self-pay | Admitting: Emergency Medicine

## 2015-03-10 NOTE — Telephone Encounter (Signed)
Pt is aware that we will be sending out his disability paperwork to [employer] this afternoon. Nothing further was needed.

## 2015-03-10 NOTE — Telephone Encounter (Addendum)
These forms were given to RB to complete. They were to be placed back in his look at after he completed them RB completed these forms. I will interoffice these back to [employer]. Pt has been made aware of this. Nothing further was needed at this time.

## 2015-03-10 NOTE — Telephone Encounter (Signed)
LL - are these papers completed?

## 2015-04-27 ENCOUNTER — Telehealth: Payer: Self-pay | Admitting: Emergency Medicine

## 2015-04-27 NOTE — Telephone Encounter (Signed)
Spoke with pt, wants to switch to [facility] primary downstairs.  Dr. Lawerance BachBurns and Okey Duprerawford are currently accepting pts.  Pt would like RB's recommendation on who to establish with.  RB please advise.  Thanks!

## 2015-04-28 NOTE — Telephone Encounter (Signed)
Spoke with pt, advised we were still waiting to hear from RB about his pcp recs.  Advised pt we would relay RB's recs to him as we have them.  RB please advise.  Thanks!

## 2015-04-28 NOTE — Telephone Encounter (Signed)
I think both would be fine.

## 2015-04-28 NOTE — Telephone Encounter (Signed)
Waiting on RB rec for pt.

## 2015-04-28 NOTE — Telephone Encounter (Signed)
Called made pt aware. Nothing further needed 

## 2015-04-28 NOTE — Telephone Encounter (Signed)
Pt would like to know what RB recommends. [phone]

## 2015-05-23 ENCOUNTER — Telehealth: Payer: Self-pay | Admitting: Emergency Medicine

## 2015-05-23 NOTE — Telephone Encounter (Signed)
Please call pred taper >  Take 40mg  daily for 3 days, then 30mg  daily for 3 days, then 20mg  daily for 3 days, then 10mg  daily for 3 days, then stop

## 2015-05-23 NOTE — Telephone Encounter (Signed)
Patient wants Rx of Prednisone.  Patient has COPD.  He has sob, some tightness in your chest, no cough. Would like prednisone so he can breath better.  Patient has [employer] insurance now.  ZO#[id number]  [facility] [address]  Allergies  Allergen Reactions  . Other Other (See Comments)    Patient claims he has a childhood allergy to "mycins."   Dr. Delton CoombesByrum, Please advise.

## 2015-05-24 ENCOUNTER — Inpatient Hospital Stay (HOSPITAL_COMMUNITY)
Admission: EM | Admit: 2015-05-24 | Discharge: 2015-05-30 | DRG: 189 | Disposition: A | Discharge status: Home / Self Care | Payer: BLUE CROSS/BLUE SHIELD | Attending: Internal Medicine | Admitting: Internal Medicine

## 2015-05-24 ENCOUNTER — Emergency Department (HOSPITAL_COMMUNITY): Payer: BLUE CROSS/BLUE SHIELD

## 2015-05-24 ENCOUNTER — Encounter (HOSPITAL_COMMUNITY): Payer: Self-pay | Admitting: Emergency Medicine

## 2015-05-24 DIAGNOSIS — R0600 Dyspnea, unspecified: Secondary | ICD-10-CM | POA: Diagnosis present

## 2015-05-24 DIAGNOSIS — J9620 Acute and chronic respiratory failure, unspecified whether with hypoxia or hypercapnia: Secondary | ICD-10-CM | POA: Diagnosis present

## 2015-05-24 DIAGNOSIS — J129 Viral pneumonia, unspecified: Secondary | ICD-10-CM | POA: Diagnosis present

## 2015-05-24 DIAGNOSIS — Z87891 Personal history of nicotine dependence: Secondary | ICD-10-CM

## 2015-05-24 DIAGNOSIS — E872 Acidosis, unspecified: Secondary | ICD-10-CM | POA: Insufficient documentation

## 2015-05-24 DIAGNOSIS — J9621 Acute and chronic respiratory failure with hypoxia: Secondary | ICD-10-CM | POA: Diagnosis present

## 2015-05-24 DIAGNOSIS — Z881 Allergy status to other antibiotic agents status: Secondary | ICD-10-CM

## 2015-05-24 DIAGNOSIS — I444 Left anterior fascicular block: Secondary | ICD-10-CM | POA: Diagnosis present

## 2015-05-24 DIAGNOSIS — J9801 Acute bronchospasm: Secondary | ICD-10-CM | POA: Diagnosis not present

## 2015-05-24 DIAGNOSIS — J9690 Respiratory failure, unspecified, unspecified whether with hypoxia or hypercapnia: Secondary | ICD-10-CM

## 2015-05-24 DIAGNOSIS — G9341 Metabolic encephalopathy: Secondary | ICD-10-CM | POA: Diagnosis present

## 2015-05-24 DIAGNOSIS — J440 Chronic obstructive pulmonary disease with acute lower respiratory infection: Secondary | ICD-10-CM | POA: Diagnosis present

## 2015-05-24 DIAGNOSIS — J9622 Acute and chronic respiratory failure with hypercapnia: Principal | ICD-10-CM | POA: Diagnosis present

## 2015-05-24 DIAGNOSIS — Z66 Do not resuscitate: Secondary | ICD-10-CM | POA: Diagnosis present

## 2015-05-24 DIAGNOSIS — Z7982 Long term (current) use of aspirin: Secondary | ICD-10-CM | POA: Diagnosis not present

## 2015-05-24 DIAGNOSIS — J441 Chronic obstructive pulmonary disease with (acute) exacerbation: Secondary | ICD-10-CM | POA: Diagnosis present

## 2015-05-24 DIAGNOSIS — Z79899 Other long term (current) drug therapy: Secondary | ICD-10-CM

## 2015-05-24 DIAGNOSIS — E43 Unspecified severe protein-calorie malnutrition: Secondary | ICD-10-CM | POA: Diagnosis present

## 2015-05-24 DIAGNOSIS — Z9981 Dependence on supplemental oxygen: Secondary | ICD-10-CM

## 2015-05-24 DIAGNOSIS — R64 Cachexia: Secondary | ICD-10-CM | POA: Diagnosis present

## 2015-05-24 DIAGNOSIS — R40242 Glasgow coma scale score 9-12, unspecified time: Secondary | ICD-10-CM | POA: Diagnosis present

## 2015-05-24 DIAGNOSIS — Z681 Body mass index (BMI) 19 or less, adult: Secondary | ICD-10-CM | POA: Diagnosis not present

## 2015-05-24 DIAGNOSIS — E875 Hyperkalemia: Secondary | ICD-10-CM | POA: Diagnosis present

## 2015-05-24 LAB — INFLUENZA PANEL BY PCR (TYPE A & B)
H1N1 flu by pcr: NOT DETECTED
INFLBPCR: NEGATIVE
Influenza A By PCR: NEGATIVE

## 2015-05-24 LAB — CBC WITH DIFFERENTIAL/PLATELET
BASOS PCT: 0 %
Basophils Absolute: 0 10*3/uL (ref 0.0–0.1)
EOS ABS: 0 10*3/uL (ref 0.0–0.7)
EOS PCT: 0 %
HEMATOCRIT: 54.2 % — AB (ref 39.0–52.0)
HEMOGLOBIN: 17.9 g/dL — AB (ref 13.0–17.0)
LYMPHS PCT: 10 %
Lymphs Abs: 1.7 10*3/uL (ref 0.7–4.0)
MCH: 32.8 pg (ref 26.0–34.0)
MCHC: 33 g/dL (ref 30.0–36.0)
MCV: 99.4 fL (ref 78.0–100.0)
MONOS PCT: 8 %
Monocytes Absolute: 1.3 10*3/uL — ABNORMAL HIGH (ref 0.1–1.0)
NEUTROS ABS: 13.8 10*3/uL — AB (ref 1.7–7.7)
NEUTROS PCT: 82 %
Platelets: 191 10*3/uL (ref 150–400)
RBC: 5.45 MIL/uL (ref 4.22–5.81)
RDW: 12.4 % (ref 11.5–15.5)
WBC: 16.8 10*3/uL — ABNORMAL HIGH (ref 4.0–10.5)

## 2015-05-24 LAB — COMPREHENSIVE METABOLIC PANEL
ALBUMIN: 4.7 g/dL (ref 3.5–5.0)
ALK PHOS: 97 U/L (ref 38–126)
ALT: 28 U/L (ref 17–63)
AST: 53 U/L — AB (ref 15–41)
Anion gap: 14 (ref 5–15)
BILIRUBIN TOTAL: 1.1 mg/dL (ref 0.3–1.2)
BUN: 27 mg/dL — AB (ref 6–20)
CALCIUM: 9.7 mg/dL (ref 8.9–10.3)
CO2: 30 mmol/L (ref 22–32)
Chloride: 95 mmol/L — ABNORMAL LOW (ref 101–111)
Creatinine, Ser: 0.89 mg/dL (ref 0.61–1.24)
GFR calc Af Amer: >60 mL/min (ref >60)
GLUCOSE: 111 mg/dL — AB (ref 65–99)
POTASSIUM: 5.5 mmol/L — AB (ref 3.5–5.1)
Sodium: 139 mmol/L (ref 135–145)
TOTAL PROTEIN: 8 g/dL (ref 6.5–8.1)

## 2015-05-24 LAB — I-STAT CG4 LACTIC ACID, ED
LACTIC ACID, VENOUS: 2.27 mmol/L — AB (ref 0.5–2.0)
LACTIC ACID, VENOUS: 5.19 mmol/L — AB (ref 0.5–2.0)

## 2015-05-24 LAB — I-STAT TROPONIN, ED
TROPONIN I, POC: 0.01 ng/mL (ref 0.00–0.08)
TROPONIN I, POC: 0.01 ng/mL (ref 0.00–0.08)

## 2015-05-24 LAB — POCT I-STAT 3, ART BLOOD GAS (G3+)
ACID-BASE DEFICIT: 3 mmol/L — AB (ref 0.0–2.0)
BICARBONATE: 27.8 meq/L — AB (ref 20.0–24.0)
O2 SAT: 99 %
PO2 ART: 173 mmHg — AB (ref 80.0–100.0)
Patient temperature: 97.3
TCO2: 30 mmol/L (ref 0–100)
pCO2 arterial: 72.9 mmHg (ref 35.0–45.0)
pH, Arterial: 7.184 — CL (ref 7.350–7.450)

## 2015-05-24 LAB — BRAIN NATRIURETIC PEPTIDE: B Natriuretic Peptide: 32.7 pg/mL (ref 0.0–100.0)

## 2015-05-24 LAB — GLUCOSE, CAPILLARY: GLUCOSE-CAPILLARY: 184 mg/dL — AB (ref 65–99)

## 2015-05-24 LAB — MRSA PCR SCREENING: MRSA BY PCR: POSITIVE — AB

## 2015-05-24 LAB — PROTIME-INR
INR: 0.91 (ref 0.00–1.49)
PROTHROMBIN TIME: 12.5 s (ref 11.6–15.2)

## 2015-05-24 LAB — CK: Total CK: 240 U/L (ref 49–397)

## 2015-05-24 MED ORDER — ALBUTEROL SULFATE (2.5 MG/3ML) 0.083% IN NEBU: 2.5000 mg | INHALATION_SOLUTION | RESPIRATORY_TRACT | Status: DC | PRN

## 2015-05-24 MED ORDER — METHYLPREDNISOLONE SODIUM SUCC 125 MG IJ SOLR
60.0000 mg | Freq: Three times a day (TID) | INTRAMUSCULAR | Status: DC
  Administered 2015-05-24 – 2015-05-26 (×5): 60 mg via INTRAVENOUS
  Filled 2015-05-24 (×4): qty 0.96
  Filled 2015-05-24: qty 2
  Filled 2015-05-24: qty 0.96
  Filled 2015-05-24: qty 2
  Filled 2015-05-24: qty 0.96

## 2015-05-24 MED ORDER — HEPARIN SODIUM (PORCINE) 5000 UNIT/ML IJ SOLN
5000.0000 [IU] | Freq: Three times a day (TID) | INTRAMUSCULAR | Status: DC
  Administered 2015-05-24 – 2015-05-30 (×17): 5000 [IU] via SUBCUTANEOUS
  Filled 2015-05-24 (×18): qty 1

## 2015-05-24 MED ORDER — CETYLPYRIDINIUM CHLORIDE 0.05 % MT LIQD
7.0000 mL | Freq: Two times a day (BID) | OROMUCOSAL | Status: DC
  Administered 2015-05-25 – 2015-05-30 (×7): 7 mL via OROMUCOSAL

## 2015-05-24 MED ORDER — LEVOFLOXACIN IN D5W 750 MG/150ML IV SOLN
750.0000 mg | INTRAVENOUS | Status: DC
  Administered 2015-05-24 – 2015-05-25 (×2): 750 mg via INTRAVENOUS
  Filled 2015-05-24 (×3): qty 150

## 2015-05-24 MED ORDER — BUDESONIDE 0.5 MG/2ML IN SUSP
0.5000 mg | Freq: Two times a day (BID) | RESPIRATORY_TRACT | Status: AC
  Administered 2015-05-24 – 2015-05-28 (×9): 0.5 mg via RESPIRATORY_TRACT
  Filled 2015-05-24 (×11): qty 2

## 2015-05-24 MED ORDER — SODIUM CHLORIDE 0.9 % IV BOLUS (SEPSIS)
1000.0000 mL | Freq: Once | INTRAVENOUS | Status: AC
  Administered 2015-05-24: 1000 mL via INTRAVENOUS

## 2015-05-24 MED ORDER — SODIUM CHLORIDE 0.9 % IV SOLN
INTRAVENOUS | Status: DC
  Administered 2015-05-24 – 2015-05-26 (×3): via INTRAVENOUS

## 2015-05-24 MED ORDER — ONDANSETRON HCL 4 MG/2ML IJ SOLN
4.0000 mg | Freq: Four times a day (QID) | INTRAMUSCULAR | Status: DC | PRN
Start: 2015-05-24 — End: 2015-05-30

## 2015-05-24 MED ORDER — IPRATROPIUM-ALBUTEROL 0.5-2.5 (3) MG/3ML IN SOLN
3.0000 mL | Freq: Four times a day (QID) | RESPIRATORY_TRACT | Status: DC
  Administered 2015-05-24 – 2015-05-27 (×12): 3 mL via RESPIRATORY_TRACT
  Filled 2015-05-24 (×11): qty 3

## 2015-05-24 MED ORDER — SODIUM CHLORIDE 0.9 % IV BOLUS (SEPSIS)
500.0000 mL | Freq: Once | INTRAVENOUS | Status: AC
  Administered 2015-05-24: 500 mL via INTRAVENOUS

## 2015-05-24 MED ORDER — CHLORHEXIDINE GLUCONATE 0.12 % MT SOLN
15.0000 mL | Freq: Two times a day (BID) | OROMUCOSAL | Status: DC
  Administered 2015-05-24 – 2015-05-30 (×12): 15 mL via OROMUCOSAL
  Filled 2015-05-24 (×7): qty 15

## 2015-05-24 MED ORDER — IPRATROPIUM BROMIDE 0.02 % IN SOLN
1.0000 mg | Freq: Once | RESPIRATORY_TRACT | Status: AC
  Administered 2015-05-24: 1 mg via RESPIRATORY_TRACT
  Filled 2015-05-24: qty 5

## 2015-05-24 MED ORDER — AZITHROMYCIN 500 MG IV SOLR
500.0000 mg | Freq: Once | INTRAVENOUS | Status: AC
  Administered 2015-05-24: 500 mg via INTRAVENOUS
  Filled 2015-05-24: qty 500

## 2015-05-24 MED ORDER — SODIUM CHLORIDE 0.9 % IV SOLN: 250.0000 mL | INTRAVENOUS | Status: DC | PRN

## 2015-05-24 MED ORDER — METHYLPREDNISOLONE SODIUM SUCC 125 MG IJ SOLR
125.0000 mg | Freq: Once | INTRAMUSCULAR | Status: AC
  Administered 2015-05-24: 125 mg via INTRAVENOUS
  Filled 2015-05-24: qty 2

## 2015-05-24 MED ORDER — ALBUTEROL (5 MG/ML) CONTINUOUS INHALATION SOLN
10.0000 mg/h | INHALATION_SOLUTION | RESPIRATORY_TRACT | Status: DC
  Administered 2015-05-24: 10 mg/h via RESPIRATORY_TRACT
  Filled 2015-05-24: qty 20

## 2015-05-24 MED ORDER — CHLORHEXIDINE GLUCONATE CLOTH 2 % EX PADS
6.0000 | MEDICATED_PAD | Freq: Every day | CUTANEOUS | Status: AC
  Administered 2015-05-25 – 2015-05-29 (×5): 6 via TOPICAL

## 2015-05-24 MED ORDER — MUPIROCIN 2 % EX OINT
1.0000 "application " | TOPICAL_OINTMENT | Freq: Two times a day (BID) | CUTANEOUS | Status: DC
  Administered 2015-05-25 – 2015-05-28 (×9): 1 via NASAL
  Filled 2015-05-24 (×3): qty 22

## 2015-05-24 NOTE — Progress Notes (Signed)
Patient having anxiety about BiPap machine. Jamison NeighborNestor, MD notified. MD camera-ed into room to assess patient. Patient placed on 4L [place] with goal sat of 88-92% per Jamison NeighborNestor, MD.

## 2015-05-24 NOTE — Telephone Encounter (Signed)
LMTCB
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
## 2015-05-24 NOTE — ED Notes (Signed)
Pt here from home with c/o resp distress sats on 4 liters was 86 % , pt received 2 nebs , mag and solumedrol from ems  ,pt arrived  On nrb gcs 9

## 2015-05-24 NOTE — Progress Notes (Signed)
eLink Physician-Brief Progress Note Patient Name: Alex CapuchinGeorge T Boyd DOB: 1956-05-01 MRN: [id number]   Date of Service  05/24/2015  HPI/Events of Note  Camera check. RN reports patient anxious on BiPAP. Requesting to try nasal cannula for a short. Hyperoxygenated to 98%.  eICU Interventions  1. Short trial on [place] off BiPAP  2. Goal Sat 88-92% readdressed with bedside RN     Intervention Category Major Interventions: Respiratory failure - evaluation and management  Lawanda CousinsJennings Ellagrace Yoshida 05/24/2015, 10:13 PM

## 2015-05-25 ENCOUNTER — Inpatient Hospital Stay (HOSPITAL_COMMUNITY): Payer: BLUE CROSS/BLUE SHIELD

## 2015-05-25 LAB — BASIC METABOLIC PANEL
ANION GAP: 10 (ref 5–15)
ANION GAP: 9 (ref 5–15)
BUN: 28 mg/dL — ABNORMAL HIGH (ref 6–20)
BUN: 29 mg/dL — AB (ref 6–20)
CALCIUM: 8.1 mg/dL — AB (ref 8.9–10.3)
CALCIUM: 8.5 mg/dL — AB (ref 8.9–10.3)
CHLORIDE: 101 mmol/L (ref 101–111)
CO2: 29 mmol/L (ref 22–32)
CO2: 32 mmol/L (ref 22–32)
Chloride: 100 mmol/L — ABNORMAL LOW (ref 101–111)
Creatinine, Ser: 0.98 mg/dL (ref 0.61–1.24)
Creatinine, Ser: 0.93 mg/dL (ref 0.61–1.24)
GFR calc Af Amer: >60 mL/min (ref >60)
GFR calc non Af Amer: >60 mL/min (ref >60)
GFR calc non Af Amer: >60 mL/min (ref >60)
GLUCOSE: 192 mg/dL — AB (ref 65–99)
GLUCOSE: 155 mg/dL — AB (ref 65–99)
POTASSIUM: 4.4 mmol/L (ref 3.5–5.1)
POTASSIUM: 4.7 mmol/L (ref 3.5–5.1)
Sodium: 140 mmol/L (ref 135–145)
Sodium: 141 mmol/L (ref 135–145)

## 2015-05-25 LAB — CBC
HEMATOCRIT: 43.7 % (ref 39.0–52.0)
HEMOGLOBIN: 14.3 g/dL (ref 13.0–17.0)
MCH: 32.2 pg (ref 26.0–34.0)
MCHC: 32.7 g/dL (ref 30.0–36.0)
MCV: 98.4 fL (ref 78.0–100.0)
Platelets: 151 10*3/uL (ref 150–400)
RBC: 4.44 MIL/uL (ref 4.22–5.81)
RDW: 12.3 % (ref 11.5–15.5)
WBC: 10.2 10*3/uL (ref 4.0–10.5)

## 2015-05-25 LAB — URINALYSIS, ROUTINE W REFLEX MICROSCOPIC
BILIRUBIN URINE: NEGATIVE
GLUCOSE, UA: 100 mg/dL — AB
Hgb urine dipstick: NEGATIVE
KETONES UR: 15 mg/dL — AB
Leukocytes, UA: NEGATIVE
Nitrite: NEGATIVE
PH: 5 (ref 5.0–8.0)
Protein, ur: NEGATIVE mg/dL
SPECIFIC GRAVITY, URINE: 1.024 (ref 1.005–1.030)

## 2015-05-25 LAB — MAGNESIUM: Magnesium: 2.3 mg/dL (ref 1.7–2.4)

## 2015-05-25 LAB — PHOSPHORUS: Phosphorus: 1.9 mg/dL — ABNORMAL LOW (ref 2.5–4.6)

## 2015-05-25 LAB — LACTIC ACID, PLASMA
Lactic Acid, Venous: 5.3 mmol/L (ref 0.5–2.0)
Lactic Acid, Venous: 3 mmol/L (ref 0.5–2.0)

## 2015-05-25 LAB — GLUCOSE, CAPILLARY: GLUCOSE-CAPILLARY: 110 mg/dL — AB (ref 65–99)

## 2015-05-25 MED ORDER — SODIUM PHOSPHATE 3 MMOLE/ML IV SOLN
30.0000 mmol | Freq: Once | INTRAVENOUS | Status: AC
  Administered 2015-05-25: 30 mmol via INTRAVENOUS
  Filled 2015-05-25: qty 10

## 2015-05-25 NOTE — Progress Notes (Signed)
CRITICAL VALUE ALERT  Critical value received: lactic 5.3  Date of notification:  05/25/15  Time of notification:  0010  Critical value read back:Yes.    Nurse who received alert:  Rosanne SackKasey RN  MD notified (1st page):  eLink MD  Time of first page:  0018

## 2015-05-25 NOTE — Progress Notes (Signed)
Came in to do pt HHN tx, pt w/ slight increased WOB.  Pt request to go back on bipap.  RN aware. PT tol well so far, sat 100%, VSS presently.

## 2015-05-25 NOTE — Progress Notes (Signed)
RN called earlier re: taking pt off bipap for meds/food.  Pt on [place], no distress noted currently, pt denies SOB presently. VSS.

## 2015-05-25 NOTE — Progress Notes (Signed)
Utilization review completed. Vamsi Apfel, RN, BSN. 

## 2015-05-25 NOTE — Progress Notes (Signed)
eLink Physician-Brief Progress Note Patient Name: Francisco CapuchinGeorge T Oetken DOB: 1955-07-05 MRN: [id number]   Date of Service  05/25/2015  HPI/Events of Note  Camera check on patient on BiPAP. Appears comfortable & no respiratory distress.  eICU Interventions  Continue current plan of care.     Intervention Category Intermediate Interventions: Other:  Lawanda CousinsJennings Kyree Fedorko 05/25/2015, 7:53 PM

## 2015-05-25 NOTE — Telephone Encounter (Signed)
lmtcb x2 for pt. 

## 2015-05-26 ENCOUNTER — Ambulatory Visit: Payer: Managed Care, Other (non HMO) | Admitting: Internal Medicine

## 2015-05-26 DIAGNOSIS — E872 Acidosis, unspecified: Secondary | ICD-10-CM | POA: Insufficient documentation

## 2015-05-26 DIAGNOSIS — J441 Chronic obstructive pulmonary disease with (acute) exacerbation: Secondary | ICD-10-CM | POA: Insufficient documentation

## 2015-05-26 DIAGNOSIS — J9622 Acute and chronic respiratory failure with hypercapnia: Secondary | ICD-10-CM | POA: Insufficient documentation

## 2015-05-26 LAB — BASIC METABOLIC PANEL
Anion gap: 5 (ref 5–15)
BUN: 25 mg/dL — AB (ref 6–20)
CHLORIDE: 103 mmol/L (ref 101–111)
CO2: 31 mmol/L (ref 22–32)
CREATININE: 0.67 mg/dL (ref 0.61–1.24)
Calcium: 8.6 mg/dL — ABNORMAL LOW (ref 8.9–10.3)
GFR calc Af Amer: >60 mL/min (ref >60)
Glucose, Bld: 109 mg/dL — ABNORMAL HIGH (ref 65–99)
Potassium: 4.7 mmol/L (ref 3.5–5.1)
SODIUM: 139 mmol/L (ref 135–145)

## 2015-05-26 LAB — RESPIRATORY VIRUS PANEL
ADENOVIRUS: NEGATIVE
INFLUENZA A: NEGATIVE
INFLUENZA B 1: NEGATIVE
Metapneumovirus: NEGATIVE
PARAINFLUENZA 1 A: NEGATIVE
PARAINFLUENZA 2 A: NEGATIVE
Parainfluenza 3: NEGATIVE
RESPIRATORY SYNCYTIAL VIRUS B: NEGATIVE
Respiratory Syncytial Virus A: NEGATIVE
Rhinovirus: NEGATIVE

## 2015-05-26 LAB — URINE CULTURE: CULTURE: NO GROWTH

## 2015-05-26 LAB — PHOSPHORUS: PHOSPHORUS: 3.1 mg/dL (ref 2.5–4.6)

## 2015-05-26 MED ORDER — METHYLPREDNISOLONE SODIUM SUCC 40 MG IJ SOLR
40.0000 mg | Freq: Three times a day (TID) | INTRAMUSCULAR | Status: DC
  Administered 2015-05-26 – 2015-05-27 (×4): 40 mg via INTRAVENOUS
  Filled 2015-05-26 (×5): qty 1

## 2015-05-26 MED ORDER — ASPIRIN 81 MG PO CHEW
81.0000 mg | CHEWABLE_TABLET | Freq: Every day | ORAL | Status: DC
  Administered 2015-05-26 – 2015-05-30 (×5): 81 mg via ORAL
  Filled 2015-05-26 (×5): qty 1

## 2015-05-26 MED ORDER — SERTRALINE HCL 50 MG PO TABS
50.0000 mg | ORAL_TABLET | Freq: Every day | ORAL | Status: DC
  Administered 2015-05-26 – 2015-05-30 (×5): 50 mg via ORAL
  Filled 2015-05-26 (×6): qty 1

## 2015-05-26 MED ORDER — ENSURE ENLIVE PO LIQD
237.0000 mL | Freq: Two times a day (BID) | ORAL | Status: DC
  Administered 2015-05-29 – 2015-05-30 (×3): 237 mL via ORAL

## 2015-05-26 NOTE — Telephone Encounter (Signed)
  Looks like pt is currently admitted to the hospital as of 05/24/15. LMTCB x3 on named VM. Advised to call back if anything further is needed as we do see now he is admitted to the hospital

## 2015-05-26 NOTE — Progress Notes (Signed)
PULMONARY / CRITICAL CARE MEDICINE   Name: [name] MRN: [id number] DOB: Oct 26, 1955    ADMISSION DATE:  05/24/2015  REFERRING MD:  ED  CHIEF COMPLAINT:  SOB  HISTORY OF PRESENT ILLNESS:   [name] is a 60 y.o. male w/ PMHx of severe COPD (FEV1 18% predicted) on 4-5L home O2, and former smoker, presents to the ED on 05/24/15 w/ complaints of 2-3 day history of worsening SOB, cough, and chest tightness. Also endorsed subjective fevers. Had called pulmonary office the day before, given Rx for Prednisone but became to SOB even to talk. In ED, patient had accessory muscle use and audible wheeze. Started on BiPAP in ED and had improvement in lethargy. Also of note, lactic acid of 5. CXR with no acute findings. Admitted to the ICU for further management.    SUBJECTIVE:  Improved this AM, pursed lip breathing, normal rate. Thirsty. Feels he is close to his baseline.   VITAL SIGNS: BP 140/87 mmHg  Pulse 96  Temp(Src) 98 F (36.7 C) (Oral)  Resp 18  Wt 105 lb 6.1 oz (47.8 kg)  SpO2 92%  HEMODYNAMICS:    VENTILATOR SETTINGS: Vent Mode:  [-] BIPAP FiO2 (%):  [40 %] 40 % Set Rate:  [0 bmp] 0 bmp PEEP:  [6 cmH20] 6 cmH20 Pressure Support:  [10 cmH20] 10 cmH20  INTAKE / OUTPUT: I/O last 3 completed shifts: In: 3926.3 [I.V.:2366.3; IV Piggyback:1560] Out: 1750 [Urine:1750]  PHYSICAL EXAMINATION:  General: Thin male, alert, cooperative, NAD. On 4L O2 HEENT: PERRL, EOMI. Moist mucus membranes. Pursed lip breathing.  Neck: Full range of motion without pain, supple, no lymphadenopathy or carotid bruits. No accessory muscle use. Lungs: Decreased throughout, scattered wheezes.  Heart: Tachycardic, regular, no murmurs, gallops, or rubs Abdomen: Soft, non-tender, non-distended, BS + Extremities: No cyanosis, clubbing, or edema Neurologic: Alert & oriented x3, cranial nerves II-XII intact, strength grossly intact, sensation intact to light touch   LABS:  BMET  Recent  Labs Lab 05/24/15 2312 05/25/15 0330 05/26/15 0412  NA 140 141 139  K 4.4 4.7 4.7  CL 101 100* 103  CO2 29 32 31  BUN 28* 29* 25*  CREATININE 0.98 0.93 0.67  GLUCOSE 192* 155* 109*    Electrolytes  Recent Labs Lab 05/24/15 2312 05/25/15 0330 05/26/15 0412  CALCIUM 8.1* 8.5* 8.6*  MG  --  2.3  --   PHOS  --  1.9* 3.1    CBC  Recent Labs Lab 05/24/15 1612 05/25/15 0330  WBC 16.8* 10.2  HGB 17.9* 14.3  HCT 54.2* 43.7  PLT 191 151    Coag's  Recent Labs Lab 05/24/15 1636  INR 0.91    Sepsis Markers  Recent Labs Lab 05/24/15 1847 05/24/15 2312 05/25/15 0330  LATICACIDVEN 5.19* 5.3* 3.0*    ABG  Recent Labs Lab 05/24/15 2351  PHART 7.184*  PCO2ART 72.9*  PO2ART 173.0*    Liver Enzymes  Recent Labs Lab 05/24/15 1612  AST 53*  ALT 28  ALKPHOS 97  BILITOT 1.1  ALBUMIN 4.7    Glucose  Recent Labs Lab 05/24/15 2054 05/25/15 0813  GLUCAP 184* 110*    Imaging No results found.   STUDIES:  Admission CXR >> lungs emphysematous but clear  CULTURES: Blood 1/4 >>> Sputum 1/4 >>> Urine 1/5 >>>  Flu PCR 1/4 >>> NEG RVP 1/4 >>>  ANTIBIOTICS: Azithromycin (ED) Levaquin 1/4 >>>  SIGNIFICANT EVENTS: 1/4 admit >>>  LINES/TUBES: PIV   ASSESSMENT /  PLAN:  PULMONARY A: Acute on chronic hypercarbic respiratory failure secondary to COPD COPD with acute exacerbation NO PNA P:  Continue steroids; decrease SuloMedrol to 40 q8h DuoNebs / Albuterol / Budesonide See ID Dc BIPAP , NOT needed  CARDIOVASCULAR A:  Sinus tachycardia P:  Monitor hemodynamics Dc tele dnr Treat resp symptoms  RENAL A:  Mild Hyperkalemia; resolved P:  IVF hydration; discontinue Repeat BMP in AM  GASTROINTESTINAL A:  Nutrition P:  Restart diet, clears for now, advance as tolerated Dc ppi if NOT home med  HEMATOLOGIC A:  Hemoconcentration; improved VTE prophylaxis P:  KVO IVF SCD's / Heparin, dc when  ambulation  INFECTIOUS A:  Possible CAP vs viral pneumonitis Flu negative P:  Dc abx, not infected, will high risk cdiff Follow cultures as above  ENDOCRINE A:  No acute issues  P:  Monitor glucose on BMP  NEUROLOGIC A:  Acute metabolic encephalopathy secondary to hypercarbia; resolved  P:  Supplemental O2; SpO2 88-92% PT  FAMILY  - Updates: Family updated at bedside.  - Inter-disciplinary family meet or Palliative Care meeting due by: 01/10.  Lauris Chroman, MD PGY-3, Internal Medicine Pager: [phone]  05/26/2015, 8:18 AM   STAFF NOTE: Cindi Carbon, MD FACP have personally reviewed patient's available data, including medical history, events of note, physical examination and test results as part of my evaluation. I have discussed with resident/NP and other care providers such as pharmacist, RN and RRT. In addition, I personally evaluated patient and elicited key findings of: no distress thi sam , resolving lactic and moving air better, dc BIPAP, move to med floor, triad, reduce steroids, Bder socntinued, dc sub q hep if ambulation, even balance goals, advance diet, O2 at baseline   Mcarthur Rossetti. Tyson Alias, MD, FACP Pgr: [phone] [facility] 05/26/2015 11:58 AM

## 2015-05-26 NOTE — Progress Notes (Signed)
Initial Nutrition Assessment  DOCUMENTATION CODES:   Severe malnutrition in context of chronic illness  INTERVENTION:   Ensure Enlive po BID, each supplement provides 350 kcal and 20 grams of protein (chocolate) Encouraged pt to drink supplements at home  NUTRITION DIAGNOSIS:   Malnutrition related to chronic illness (Severe COPD) as evidenced by severe depletion of body fat, severe depletion of muscle mass, percent weight loss.  GOAL:   Patient will meet greater than or equal to 90% of their needs  MONITOR:   PO intake, Supplement acceptance, Labs, Weight trends, I & O's  REASON FOR ASSESSMENT:   Malnutrition Screening Tool   ASSESSMENT:   Pt with PMHx of severe COPD (FEV1 18% predicted) on 4-5L home O2, and former smoker, presents to the ED on 05/24/15 w/ complaints of 2-3 day history of worsening SOB, cough, and chest tightness. Admitted with acute COPD exacerbation.   Labs reviewed: phosphorus low (1.9) Nutrition-Focused physical exam completed. Findings are severe fat depletion, severe muscle depletion, and no edema.  Per pt he lives at home alone. He orders 1-2 meals out that last him a few meals. He is SOB during conversation. He confirms weight loss within the last 3 months, his usual weight is 115 lb. Has bought some muscle milk but has not been drinking them, he knows he should. Agreeable to ensure while hospitalized.    Diet Order:  Diet regular Room service appropriate?: Yes; Fluid consistency:: Thin  Skin:  Reviewed, no issues  Last BM:  1/6  Height:   Ht Readings from Last 1 Encounters:  05/26/15 5\' 3"  (1.6 m)   Weight:   Wt Readings from Last 1 Encounters:  05/26/15 105 lb 6.1 oz (47.8 kg)   Ideal Body Weight:  56.3 kg  BMI:  Body mass index is 18.67 kg/(m^2).  Estimated Nutritional Needs:   Kcal:  1500-1700  Protein:  75-85 grams  Fluid:  > 1.5 L/day  EDUCATION NEEDS:   No education needs identified at this time  Kendell BaneHeather Dougles Kimmey RD, LDN,  CNSC [phone] Pager [phone] After Hours Pager

## 2015-05-27 DIAGNOSIS — J9620 Acute and chronic respiratory failure, unspecified whether with hypoxia or hypercapnia: Secondary | ICD-10-CM

## 2015-05-27 DIAGNOSIS — E872 Acidosis: Secondary | ICD-10-CM

## 2015-05-27 LAB — BASIC METABOLIC PANEL
Anion gap: 5 (ref 5–15)
BUN: 24 mg/dL — AB (ref 6–20)
CHLORIDE: 105 mmol/L (ref 101–111)
CO2: 32 mmol/L (ref 22–32)
Calcium: 8.7 mg/dL — ABNORMAL LOW (ref 8.9–10.3)
Creatinine, Ser: 0.59 mg/dL — ABNORMAL LOW (ref 0.61–1.24)
GFR calc Af Amer: >60 mL/min (ref >60)
GFR calc non Af Amer: >60 mL/min (ref >60)
GLUCOSE: 118 mg/dL — AB (ref 65–99)
POTASSIUM: 4.4 mmol/L (ref 3.5–5.1)
Sodium: 142 mmol/L (ref 135–145)

## 2015-05-27 MED ORDER — IPRATROPIUM-ALBUTEROL 0.5-2.5 (3) MG/3ML IN SOLN
3.0000 mL | Freq: Four times a day (QID) | RESPIRATORY_TRACT | Status: DC
  Administered 2015-05-27 – 2015-05-28 (×2): 3 mL via RESPIRATORY_TRACT
  Filled 2015-05-27 (×2): qty 3

## 2015-05-27 MED ORDER — METHYLPREDNISOLONE SODIUM SUCC 40 MG IJ SOLR
40.0000 mg | Freq: Two times a day (BID) | INTRAMUSCULAR | Status: AC
  Administered 2015-05-28 (×2): 40 mg via INTRAVENOUS
  Filled 2015-05-27 (×2): qty 1

## 2015-05-27 MED ORDER — ALBUTEROL SULFATE (2.5 MG/3ML) 0.083% IN NEBU
2.5000 mg | INHALATION_SOLUTION | RESPIRATORY_TRACT | Status: DC | PRN
  Administered 2015-05-29 – 2015-05-30 (×3): 2.5 mg via RESPIRATORY_TRACT
  Filled 2015-05-27 (×3): qty 3

## 2015-05-27 NOTE — Evaluation (Signed)
Physical Therapy Evaluation Patient Details Name: Alex CapuchinGeorge T Tadesse MRN: [id number] DOB: 18-Jul-1955 Today's Date: 05/27/2015   History of Present Illness  Pt adm with respiratory failure due to COPD exacerbation. Pt placed on BIPAP on admission.  PMH - severe COPD,   Clinical Impression  Pt presents to PT with decr mobility due to limited functional activity tolerance due to SOB and decr SpO2. Moving well but requires frequent breaks to recover. Needs skilled PT to maximize independence so pt can return home with incr activity tolerance.    Follow Up Recommendations No PT follow up    Equipment Recommendations  Other (comment) (May benefit from rollator)    Recommendations for Other Services       Precautions / Restrictions Precautions Precautions: None Restrictions Weight Bearing Restrictions: No      Mobility  Bed Mobility Overal bed mobility: Modified Independent                Transfers Overall transfer level: Modified independent                  Ambulation/Gait Ambulation/Gait assistance: Supervision Ambulation Distance (Feet): 300 Feet Assistive device:  (pushing w/c) Gait Pattern/deviations: Step-through pattern;Decreased stride length Gait velocity: decr Gait velocity interpretation: Below normal speed for age/gender General Gait Details: Pt amb pushing w/c and on 6L of O2. Pt required 4 standing rest breaks of several minutes each and pt performing pursed lip breathing. SpO2 dipped to 86-87% at which point pt stopped and was able to get O2 back to 92-93%.  Stairs            Wheelchair Mobility    Modified Rankin (Stroke Patients Only)       Balance Overall balance assessment: Needs assistance Sitting-balance support: No upper extremity supported;Feet supported Sitting balance-Leahy Scale: Normal     Standing balance support: No upper extremity supported;During functional activity Standing balance-Leahy Scale: Good                                Pertinent Vitals/Pain Pain Assessment: No/denies pain    Home Living Family/patient expects to be discharged to:: Private residence Living Arrangements: Alone             Home Equipment: Other (comment) (O2)      Prior Function Level of Independence: Independent         Comments: Uses O2 at home. Drives, shops etc.      Hand Dominance        Extremity/Trunk Assessment   Upper Extremity Assessment: Overall WFL for tasks assessed           Lower Extremity Assessment: Overall WFL for tasks assessed         Communication   Communication: No difficulties  Cognition Arousal/Alertness: Awake/alert Behavior During Therapy: WFL for tasks assessed/performed Overall Cognitive Status: Within Functional Limits for tasks assessed                 General Comments: Pt very talkative.    General Comments      Exercises        Assessment/Plan    PT Assessment Patient needs continued PT services  PT Diagnosis Difficulty walking   PT Problem List Decreased activity tolerance;Decreased mobility;Cardiopulmonary status limiting activity  PT Treatment Interventions DME instruction;Gait training;Functional mobility training;Patient/family education   PT Goals (Current goals can be found in the Care Plan section) Acute Rehab PT Goals Patient Stated  Goal: return home and increase activity PT Goal Formulation: With patient Time For Goal Achievement: 06/03/15 Potential to Achieve Goals: Good    Frequency Min 3X/week   Barriers to discharge Decreased caregiver support      Co-evaluation               End of Session Equipment Utilized During Treatment: Oxygen Activity Tolerance: Treatment limited secondary to medical complications (Comment) (decr SpO2) Patient left: in chair;with call bell/phone within reach Nurse Communication: Mobility status         Time: 0727-0802 PT Time Calculation (min) (ACUTE ONLY): 35  min   Charges:   PT Evaluation $PT Eval Moderate Complexity: 1 Procedure     PT G Codes:        Javaria Knapke 2015-06-16, 8:19 AM [facility] PT [phone]

## 2015-05-27 NOTE — Progress Notes (Signed)
[place] TEAM 1 - Stepdown/ICU TEAM PROGRESS NOTE  Francisco CapuchinGeorge T Tones WUJ:[id number] DOB: [birth year] DOA: 05/24/2015 PCP: [name], [name], MD  Admit HPI / Brief Narrative54: 60 y.o. 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 Had called his pulmonary office the day before and was given Rx for Prednisone but became too SOB even to talk. In ED, patient was using accessory muscles and was audibly wheezing. Started on BiPAP in ED and had improvement in lethargy. Lactic acid was 5. CXR with no acute findings. Admitted to the ICU.  Significant Events: 1/4 admit 1/7 TRH assumed care   HPI/Subjective: The patient states he is feeling better overall.  The nurse reports she did have a severe episode of bronchospasm this morning but has since stabilized.  He denies current chest pain nausea vomiting or abdominal pain.  Assessment/Plan:  Acute on chronic hypercarbic respiratory failure secondary to COPD with acute exacerbation No evidence of a PNA - appears stable on 4L [place] O2 (home dose) - flu negative - watch for recurring episodes of bronchospasm  Sinus tachycardia Due to acute resp distress - appears to be calming for now - follow  Hyperkalemia resolved  Hemoconcentration improved  Acute metabolic encephalopathy secondary to hypercarbia resolved   Severe malnutrition in context of chronic illness Most c/w pulmonary cachexia - encourage diet/nutrition consult   MRSA screen +  Code Status: NO CODE/DNR Family Communication: no family present at time of exam Disposition Plan: SDU additional 24hrs   Consultants: PCCM  Antibiotics: Levaquin 1/4 > 1/5  DVT prophylaxis: SQ heparin   Objective: Blood pressure 166/102, pulse 102, temperature 96.7 F (35.9 C), temperature source Axillary, resp. rate 28, height 5\' 3"  (1.6 m), weight 47.7  kg (105 lb 2.6 oz), SpO2 95 %.  Intake/Output Summary (Last 24 hours) at 05/27/15 1540 Last data filed at 05/27/15 1453  Gross per 24 hour  Intake    900 ml  Output   1000 ml  Net   -100 ml   Exam: General: No acute respiratory distress at rest in bed  Lungs: diffuse exp wheezing - no focal crackles - very poor air movement th/o  Cardiovascular: Regular rate and rhythm - very distant HS  Abdomen: Nontender, nondistended, soft, bowel sounds positive, no rebound, no ascites, no appreciable mass Extremities: No significant cyanosis, clubbing, or edema bilateral lower extremities  Data Reviewed: Basic Metabolic Panel:  Recent Labs Lab 05/24/15 1612 05/24/15 2312 05/25/15 0330 05/26/15 0412 05/27/15 0330  NA 139 140 141 139 142  K 5.5* 4.4 4.7 4.7 4.4  CL 95* 101 100* 103 105  CO2 30 29 32 31 32  GLUCOSE 111* 192* 155* 109* 118*  BUN 27* 28* 29* 25* 24*  CREATININE 0.89 0.98 0.93 0.67 0.59*  CALCIUM 9.7 8.1* 8.5* 8.6* 8.7*  MG  --   --  2.3  --   --   PHOS  --   --  1.9* 3.1  --     CBC:  Recent Labs Lab 05/24/15 1612 05/25/15 0330  WBC 16.8* 10.2  NEUTROABS 13.8*  --   HGB 17.9* 14.3  HCT 54.2* 43.7  MCV 99.4 98.4  PLT 191 151    Liver Function Tests:  Recent Labs Lab 05/24/15 1612  AST 53*  ALT 28  ALKPHOS 97  BILITOT 1.1  PROT  8.0  ALBUMIN 4.7    Coags:  Recent Labs Lab 05/24/15 1636  INR 0.91    Cardiac Enzymes:  Recent Labs Lab 05/24/15 1612  CKTOTAL 240    CBG:  Recent Labs Lab 05/24/15 2054 05/25/15 0813  GLUCAP 184* 110*    Recent Results (from the past 240 hour(s))  MRSA PCR Screening     Status: Abnormal   Collection Time: 05/24/15  8:53 PM  Result Value Ref Range Status   MRSA by PCR POSITIVE (A) NEGATIVE Final    Comment:        The GeneXpert MRSA Assay (FDA approved for NASAL specimens only), is one component of a comprehensive MRSA colonization surveillance program. It is not intended to diagnose  MRSA infection nor to guide or monitor treatment for MRSA infections. RESULT CALLED TO, READ BACK BY AND VERIFIED WITH: L WALSH @2323  05/24/15 MKELLY   Respiratory virus panel     Status: None   Collection Time: 05/24/15  9:18 PM  Result Value Ref Range Status   Respiratory Syncytial Virus A Negative Negative Final   Respiratory Syncytial Virus B Negative Negative Final   Influenza A Negative Negative Final   Influenza B Negative Negative Final   Parainfluenza 1 Negative Negative Final   Parainfluenza 2 Negative Negative Final   Parainfluenza 3 Negative Negative Final   Metapneumovirus Negative Negative Final   Rhinovirus Negative Negative Final   Adenovirus Negative Negative Final    Comment: (NOTE) Performed At: [facility] [address] [place], [place] [id number] Mila Homer MD JY:[id number]   Culture, blood (routine x 2)     Status: None (Preliminary result)   Collection Time: 05/24/15 11:12 PM  Result Value Ref Range Status   Specimen Description BLOOD RIGHT ANTECUBITAL  Final   Special Requests BOTTLES DRAWN AEROBIC AND ANAEROBIC 10CC   Final   Culture NO GROWTH 3 DAYS  Final   Report Status PENDING  Incomplete  Culture, blood (routine x 2)     Status: None (Preliminary result)   Collection Time: 05/24/15 11:20 PM  Result Value Ref Range Status   Specimen Description BLOOD RIGHT FOREARM  Final   Special Requests BOTTLES DRAWN AEROBIC AND ANAEROBIC 5CC  Final   Culture NO GROWTH 3 DAYS  Final   Report Status PENDING  Incomplete  Urine culture     Status: None   Collection Time: 05/25/15  2:30 AM  Result Value Ref Range Status   Specimen Description URINE, CLEAN CATCH  Final   Special Requests NONE  Final   Culture NO GROWTH 1 DAY  Final   Report Status 05/26/2015 FINAL  Final     Studies:   Recent x-ray studies have been reviewed in detail by the Attending Physician  Scheduled Meds:  Scheduled Meds: . antiseptic oral rinse  7 mL Mouth Rinse  q12n4p  . aspirin  81 mg Oral Daily  . budesonide  0.5 mg Nebulization BID  . chlorhexidine  15 mL Mouth Rinse BID  . Chlorhexidine Gluconate Cloth  6 each Topical Q0600  . feeding supplement (ENSURE ENLIVE)  237 mL Oral BID BM  . heparin  5,000 Units Subcutaneous 3 times per day  . ipratropium-albuterol  3 mL Nebulization Q6H  . methylPREDNISolone (SOLU-MEDROL) injection  40 mg Intravenous 3 times per day  . mupirocin ointment  1 application Nasal BID  . sertraline  50 mg Oral Daily    Time spent on care of this patient: 35 mins  Lonia Blood , MD   [facility] Office  [phone] Pager - Text Page per Amion as per below:  On-Call/Text Page:      [email or web address]      password [email or web address]  If 7PM-7AM, please contact night-coverage [email or web address] Password [email or web address] 05/27/2015, 3:40 PM   LOS: 3 days

## 2015-05-28 DIAGNOSIS — J9621 Acute and chronic respiratory failure with hypoxia: Secondary | ICD-10-CM

## 2015-05-28 MED ORDER — IPRATROPIUM-ALBUTEROL 0.5-2.5 (3) MG/3ML IN SOLN
3.0000 mL | Freq: Three times a day (TID) | RESPIRATORY_TRACT | Status: AC
  Administered 2015-05-28 (×2): 3 mL via RESPIRATORY_TRACT
  Filled 2015-05-28 (×3): qty 3

## 2015-05-28 MED ORDER — PREDNISONE 20 MG PO TABS
20.0000 mg | ORAL_TABLET | Freq: Two times a day (BID) | ORAL | Status: DC
  Administered 2015-05-29 – 2015-05-30 (×3): 20 mg via ORAL
  Filled 2015-05-28 (×3): qty 1

## 2015-05-28 MED ORDER — SPIRITUS FRUMENTI
1.0000 | Freq: Every day | ORAL | Status: DC | PRN
  Administered 2015-05-29: 1 via ORAL
  Filled 2015-05-28 (×3): qty 2

## 2015-05-28 MED ORDER — SERTRALINE HCL 50 MG PO TABS: 50.0000 mg | ORAL_TABLET | Freq: Every day | ORAL | Status: DC

## 2015-05-28 MED ORDER — MOMETASONE FURO-FORMOTEROL FUM 100-5 MCG/ACT IN AERO
2.0000 | INHALATION_SPRAY | Freq: Two times a day (BID) | RESPIRATORY_TRACT | Status: DC
  Administered 2015-05-29: 2 via RESPIRATORY_TRACT
  Filled 2015-05-28: qty 8.8

## 2015-05-28 MED ORDER — TIOTROPIUM BROMIDE MONOHYDRATE 18 MCG IN CAPS
18.0000 ug | ORAL_CAPSULE | Freq: Every day | RESPIRATORY_TRACT | Status: DC
  Administered 2015-05-29: 18 ug via RESPIRATORY_TRACT
  Filled 2015-05-28: qty 5

## 2015-05-28 NOTE — Progress Notes (Signed)
Report called to [address]. Pt will transfer in wheelchair. Marisue Ivanobyn Toluwani Ruder RN

## 2015-05-28 NOTE — Progress Notes (Signed)
[place] TEAM 1 - Stepdown/ICU TEAM PROGRESS NOTE  FREDERIK STANDLEY OZH:[id number] DOB: [birth year] DOA: 05/24/2015 PCP: Kristie Cowman, MD  Admit HPI / Brief Narrative: 60 y.o. 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 Had called his pulmonary office the day before and was given Rx for Prednisone but became too SOB even to talk. In ED, patient was using accessory muscles and was audibly wheezing. Started on BiPAP in ED and had improvement in lethargy. Lactic acid was 5. CXR with no acute findings. Admitted to the ICU.  Significant Events: 1/4 admit 1/7 TRH assumed care   HPI/Subjective: The pt is resting comfortably, and feels his breathing is almost back to his baseline.  He denies cp, n/v, or abdom pain.  He has been getting up some in the room, but has not yet ambulated around the unit.     Assessment/Plan:  Acute on chronic hypercarbic respiratory failure secondary to COPD with acute exacerbation No evidence of a PNA - appears stable on 4L [place] O2 (home dose) - flu negative - begin to ambulate further - possible d/c in AM   Sinus tachycardia Due to acute resp distress - much improved today - d/c tele   Hyperkalemia resolved  Hemoconcentration improved  Acute metabolic encephalopathy secondary to hypercarbia resolved - pt now alert and oriented   Severe malnutrition in context of chronic illness Most c/w pulmonary cachexia - encourage diet/nutrition consult   MRSA screen +  Code Status: NO CODE/DNR Family Communication: no family present at time of exam Disposition Plan: transfer to med bed - PT/OT evals - possible d/c in next 24-48hrs   Consultants: PCCM  Antibiotics: Levaquin 1/4 > 1/5  DVT prophylaxis: SQ heparin   Objective: Blood pressure 150/88, pulse 112, temperature 97.8 F (36.6 C), temperature source  Axillary, resp. rate 19, height 5\' 3"  (1.6 m), weight 48 kg (105 lb 13.1 oz), SpO2 91 %.  Intake/Output Summary (Last 24 hours) at 05/28/15 1521 Last data filed at 05/28/15 1514  Gross per 24 hour  Intake   1080 ml  Output   1475 ml  Net   -395 ml   Exam: General: No acute respiratory distress at rest in bed - able to complete full sentenced w/o difficulty  Lungs: no wheezing today - no focal crackles - very poor air movement th/o  Cardiovascular: regular rate and rhythm - very distant HS - no appreciable M Abdomen: Nontender, nondistended, soft, bowel sounds positive, no rebound, no ascites, no appreciable mass Extremities: No significant cyanosis, clubbing, edema bilateral lower extremities  Data Reviewed: Basic Metabolic Panel:  Recent Labs Lab 05/24/15 1612 05/24/15 2312 05/25/15 0330 05/26/15 0412 05/27/15 0330  NA 139 140 141 139 142  K 5.5* 4.4 4.7 4.7 4.4  CL 95* 101 100* 103 105  CO2 30 29 32 31 32  GLUCOSE 111* 192* 155* 109* 118*  BUN 27* 28* 29* 25* 24*  CREATININE 0.89 0.98 0.93 0.67 0.59*  CALCIUM 9.7 8.1* 8.5* 8.6* 8.7*  MG  --   --  2.3  --   --   PHOS  --   --  1.9* 3.1  --     CBC:  Recent Labs Lab 05/24/15 1612 05/25/15 0330  WBC 16.8* 10.2  NEUTROABS 13.8*  --   HGB 17.9* 14.3  HCT 54.2*  43.7  MCV 99.4 98.4  PLT 191 151    Liver Function Tests:  Recent Labs Lab 05/24/15 1612  AST 53*  ALT 28  ALKPHOS 97  BILITOT 1.1  PROT 8.0  ALBUMIN 4.7    Coags:  Recent Labs Lab 05/24/15 1636  INR 0.91    Cardiac Enzymes:  Recent Labs Lab 05/24/15 1612  CKTOTAL 240    CBG:  Recent Labs Lab 05/24/15 2054 05/25/15 0813  GLUCAP 184* 110*    Recent Results (from the past 240 hour(s))  MRSA PCR Screening     Status: Abnormal   Collection Time: 05/24/15  8:53 PM  Result Value Ref Range Status   MRSA by PCR POSITIVE (A) NEGATIVE Final    Comment:        The GeneXpert MRSA Assay (FDA approved for NASAL specimens only), is  one component of a comprehensive MRSA colonization surveillance program. It is not intended to diagnose MRSA infection nor to guide or monitor treatment for MRSA infections. RESULT CALLED TO, READ BACK BY AND VERIFIED WITH: L WALSH @2323  05/24/15 MKELLY   Respiratory virus panel     Status: None   Collection Time: 05/24/15  9:18 PM  Result Value Ref Range Status   Respiratory Syncytial Virus A Negative Negative Final   Respiratory Syncytial Virus B Negative Negative Final   Influenza A Negative Negative Final   Influenza B Negative Negative Final   Parainfluenza 1 Negative Negative Final   Parainfluenza 2 Negative Negative Final   Parainfluenza 3 Negative Negative Final   Metapneumovirus Negative Negative Final   Rhinovirus Negative Negative Final   Adenovirus Negative Negative Final    Comment: (NOTE) Performed At: [facility] [address] [place], [place] [id number] Mila Homer MD NW:[id number]   Culture, blood (routine x 2)     Status: None (Preliminary result)   Collection Time: 05/24/15 11:12 PM  Result Value Ref Range Status   Specimen Description BLOOD RIGHT ANTECUBITAL  Final   Special Requests BOTTLES DRAWN AEROBIC AND ANAEROBIC 10CC   Final   Culture NO GROWTH 4 DAYS  Final   Report Status PENDING  Incomplete  Culture, blood (routine x 2)     Status: None (Preliminary result)   Collection Time: 05/24/15 11:20 PM  Result Value Ref Range Status   Specimen Description BLOOD RIGHT FOREARM  Final   Special Requests BOTTLES DRAWN AEROBIC AND ANAEROBIC 5CC  Final   Culture NO GROWTH 4 DAYS  Final   Report Status PENDING  Incomplete  Urine culture     Status: None   Collection Time: 05/25/15  2:30 AM  Result Value Ref Range Status   Specimen Description URINE, CLEAN CATCH  Final   Special Requests NONE  Final   Culture NO GROWTH 1 DAY  Final   Report Status 05/26/2015 FINAL  Final     Studies:   Recent x-ray studies have been reviewed in detail  by the Attending Physician  Scheduled Meds:  Scheduled Meds: . antiseptic oral rinse  7 mL Mouth Rinse q12n4p  . aspirin  81 mg Oral Daily  . budesonide  0.5 mg Nebulization BID  . chlorhexidine  15 mL Mouth Rinse BID  . Chlorhexidine Gluconate Cloth  6 each Topical Q0600  . feeding supplement (ENSURE ENLIVE)  237 mL Oral BID BM  . heparin  5,000 Units Subcutaneous 3 times per day  . ipratropium-albuterol  3 mL Nebulization TID  . methylPREDNISolone (SOLU-MEDROL) injection  40  mg Intravenous Q12H  . mupirocin ointment  1 application Nasal BID  . sertraline  50 mg Oral Daily    Time spent on care of this patient: 25 mins   [facility] Rehabilitaion HospitalMCCLUNG,Camil Wilhelmsen T , MD   [facility] Office  [phone] Pager - Text Page per Loretha StaplerAmion as per below:  On-Call/Text Page:      [email or web address]      password [email or web address]  If 7PM-7AM, please contact night-coverage [email or web address] Password [email or web address] 05/28/2015, 3:21 PM   LOS: 4 days

## 2015-05-28 NOTE — Care Management Note (Signed)
Case Management Note  Patient Details  Name: [name] MRN: [id number] Date of Birth: 07-05-1955  Subjective/Objective:            Admitted with SOB, Hx of severe COPD, on 4-5L home O2. Lives alone. Independent with ADL's pta.    Action/Plan: CM spoke with pt regarding d/c planning via phone conversation and pt stated he will return home after a 2-3 day stay with brother([name]) once d/c.  Expected Discharge Date:  05/29/15               Expected Discharge Plan:  Home/Self Care  In-House Referral:     Discharge planning Services  CM Consult  Post Acute Care Choice:  Resumption of Svcs/PTA Provider Choice offered to:     DME Arranged:  [facility]  HH Arranged:    HH Agency:     Status of Service:  Completed, signed off  Medicare Important Message Given:    Date Medicare IM Given:    Medicare IM give by:    Date Additional Medicare IM Given:    Additional Medicare Important Message give by:     If discussed at Long Length of Stay Meetings, dates discussed:    Additional Comments: [name] (Brother) [phone]  [name], [name], [place]RN,BSN,CM [phone] 05/28/2015, 5:35 PM

## 2015-05-28 NOTE — Progress Notes (Signed)
Pt ambulated in hall [facility]. Pt maintained and O2 SAT of 93% with short rest breaks every 26300ft. Marisue Ivanobyn Samaad Hashem RN

## 2015-05-29 LAB — CULTURE, BLOOD (ROUTINE X 2)
Culture: NO GROWTH
Culture: NO GROWTH

## 2015-05-29 MED ORDER — IPRATROPIUM-ALBUTEROL 0.5-2.5 (3) MG/3ML IN SOLN
3.0000 mL | Freq: Four times a day (QID) | RESPIRATORY_TRACT | Status: AC
  Administered 2015-05-29 (×2): 3 mL via RESPIRATORY_TRACT
  Filled 2015-05-29 (×2): qty 3

## 2015-05-29 MED ORDER — BUDESONIDE 0.5 MG/2ML IN SUSP
0.5000 mg | Freq: Two times a day (BID) | RESPIRATORY_TRACT | Status: DC
  Administered 2015-05-29 – 2015-05-30 (×2): 0.5 mg via RESPIRATORY_TRACT
  Filled 2015-05-29 (×2): qty 2

## 2015-05-29 NOTE — Progress Notes (Signed)
[place] TEAM 1 - Stepdown/ICU TEAM PROGRESS NOTE  [name] WJX:[id number] DOB: May 26, 1955 DOA: 05/24/2015 PCP: [name], [name], MD  Admit HPI / Brief Narrative29: 59 y.o. 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 Had called his pulmonary office the day before and was given Rx for Prednisone but became too SOB even to talk. In ED, patient was using accessory muscles and was audibly wheezing. Started on BiPAP in ED and had improvement in lethargy. Lactic acid was 5. CXR with no acute findings. Admitted to the ICU.  Significant Events: 1/4 admit 1/7 TRH assumed care   HPI/Subjective: The pt states he is more sob today, and experiencing more wheezing than yesterday.  He tells me he has not yet had his spiriva or advair today.  I was attempting to transition him from scheduled nebs to long acting inhalers today, in a trial for possible d/c home.  He denies cp, n/v, or abdom pain.    Assessment/Plan:  Acute on chronic hypercarbic respiratory failure secondary to COPD with acute exacerbation No evidence of a PNA - flu negative - worsening of wheezing today appears to be due to lack of inhaled medication tx today (not clear why he did not get 8AM doses - resume scheduled nebs for now and reassess in AM - clearly not ready for d/c w/ increased wheezing today   Sinus tachycardia Due to acute resp distress - much improved - follow w/o tele for now   Hyperkalemia resolved  Hemoconcentration improved  Acute metabolic encephalopathy secondary to hypercarbia resolved - pt now alert and oriented   Severe malnutrition in context of chronic illness Most c/w pulmonary cachexia - encourage diet/nutrition consult   MRSA screen +  Code Status: NO CODE/DNR Family Communication: no family present at time of exam Disposition Plan: PT/OT evals  - resume scheduled nebs and reassess in AM - d/c home when wheezing ceased and resp status back to baseline    Consultants: PCCM  Antibiotics: Levaquin 1/4 > 1/5  DVT prophylaxis: SQ heparin   Objective: Blood pressure 143/87, pulse 116, temperature 97.6 F (36.4 C), temperature source Axillary, resp. rate 18, height 5\' 3"  (1.6 m), weight 48.7 kg (107 lb 5.8 oz), SpO2 92 %.  Intake/Output Summary (Last 24 hours) at 05/29/15 1452 Last data filed at 05/29/15 1300  Gross per 24 hour  Intake    720 ml  Output   1270 ml  Net   -550 ml   Exam: General: must pause mid sentence to breath - pursed lip breathing  Lungs: diffuse exp wheeze - poor air movement th/o all fields - no crackles  Cardiovascular: regular rate and rhythm - distant HS - no appreciable M Abdomen: Nontender, nondistended, soft, bowel sounds positive, no rebound, no ascites Extremities: No significant cyanosis, clubbing, or edema bilateral lower extremities  Data Reviewed: Basic Metabolic Panel:  Recent Labs Lab 05/24/15 1612 05/24/15 2312 05/25/15 0330 05/26/15 0412 05/27/15 0330  NA 139 140 141 139 142  K 5.5* 4.4 4.7 4.7 4.4  CL 95* 101 100* 103 105  CO2 30 29 32 31 32  GLUCOSE 111* 192* 155* 109* 118*  BUN 27* 28* 29* 25* 24*  CREATININE 0.89 0.98 0.93 0.67 0.59*  CALCIUM 9.7 8.1* 8.5* 8.6* 8.7*  MG  --   --  2.3  --   --  PHOS  --   --  1.9* 3.1  --     CBC:  Recent Labs Lab 05/24/15 1612 05/25/15 0330  WBC 16.8* 10.2  NEUTROABS 13.8*  --   HGB 17.9* 14.3  HCT 54.2* 43.7  MCV 99.4 98.4  PLT 191 151    Liver Function Tests:  Recent Labs Lab 05/24/15 1612  AST 53*  ALT 28  ALKPHOS 97  BILITOT 1.1  PROT 8.0  ALBUMIN 4.7    Coags:  Recent Labs Lab 05/24/15 1636  INR 0.91    Cardiac Enzymes:  Recent Labs Lab 05/24/15 1612  CKTOTAL 240    CBG:  Recent Labs Lab 05/24/15 2054 05/25/15 0813  GLUCAP 184* 110*    Recent Results (from the past 240 hour(s))    MRSA PCR Screening     Status: Abnormal   Collection Time: 05/24/15  8:53 PM  Result Value Ref Range Status   MRSA by PCR POSITIVE (A) NEGATIVE Final    Comment:        The GeneXpert MRSA Assay (FDA approved for NASAL specimens only), is one component of a comprehensive MRSA colonization surveillance program. It is not intended to diagnose MRSA infection nor to guide or monitor treatment for MRSA infections. RESULT CALLED TO, READ BACK BY AND VERIFIED WITH: L WALSH @2323  05/24/15 MKELLY   Respiratory virus panel     Status: None   Collection Time: 05/24/15  9:18 PM  Result Value Ref Range Status   Respiratory Syncytial Virus A Negative Negative Final   Respiratory Syncytial Virus B Negative Negative Final   Influenza A Negative Negative Final   Influenza B Negative Negative Final   Parainfluenza 1 Negative Negative Final   Parainfluenza 2 Negative Negative Final   Parainfluenza 3 Negative Negative Final   Metapneumovirus Negative Negative Final   Rhinovirus Negative Negative Final   Adenovirus Negative Negative Final    Comment: (NOTE) Performed At: [facility] [address] [place], [place] [id number] Mila Homer MD WU:[id number]   Culture, blood (routine x 2)     Status: None (Preliminary result)   Collection Time: 05/24/15 11:12 PM  Result Value Ref Range Status   Specimen Description BLOOD RIGHT ANTECUBITAL  Final   Special Requests BOTTLES DRAWN AEROBIC AND ANAEROBIC 10CC   Final   Culture NO GROWTH 4 DAYS  Final   Report Status PENDING  Incomplete  Culture, blood (routine x 2)     Status: None (Preliminary result)   Collection Time: 05/24/15 11:20 PM  Result Value Ref Range Status   Specimen Description BLOOD RIGHT FOREARM  Final   Special Requests BOTTLES DRAWN AEROBIC AND ANAEROBIC 5CC  Final   Culture NO GROWTH 4 DAYS  Final   Report Status PENDING  Incomplete  Urine culture     Status: None   Collection Time: 05/25/15  2:30 AM  Result  Value Ref Range Status   Specimen Description URINE, CLEAN CATCH  Final   Special Requests NONE  Final   Culture NO GROWTH 1 DAY  Final   Report Status 05/26/2015 FINAL  Final     Studies:   Recent x-ray studies have been reviewed in detail by the Attending Physician  Scheduled Meds:  Scheduled Meds: . antiseptic oral rinse  7 mL Mouth Rinse q12n4p  . aspirin  81 mg Oral Daily  . chlorhexidine  15 mL Mouth Rinse BID  . feeding supplement (ENSURE ENLIVE)  237 mL Oral BID BM  .  heparin  5,000 Units Subcutaneous 3 times per day  . mometasone-formoterol  2 puff Inhalation BID  . mupirocin ointment  1 application Nasal BID  . predniSONE  20 mg Oral BID WC  . sertraline  50 mg Oral Daily  . tiotropium  18 mcg Inhalation Daily    Time spent on care of this patient: 25 mins   Mamadou Breon T , MD   [facility] Office  [phone] Pager - Text Page per Loretha Stapler as per below:  On-Call/Text Page:      [email or web address]      password [email or web address]  If 7PM-7AM, please contact night-coverage [email or web address] Password [email or web address] 05/29/2015, 2:52 PM   LOS: 5 days

## 2015-05-29 NOTE — Progress Notes (Signed)
Physical Therapy Treatment Patient Details Name: [name] MRN: [id number] DOB: 20-Nov-1955 Today's Date: 05/29/2015    History of Present Illness Pt adm with respiratory failure due to COPD exacerbation. Pt placed on BIPAP on admission.  PMH - severe COPD,     PT Comments    Patient with ability to ambulate 300 ft with 6L O2 via nasal canula and requiring 5 standing rest breaks when SOB. SpO2 remained 90-97% throughout session. Continues to be limited secondary to SOB. No unsteadiness or LOB during session. If available, use Rollator next session.   Follow Up Recommendations  No PT follow up     Equipment Recommendations  Other (comment) (May benefit from rollator)    Recommendations for Other Services       Precautions / Restrictions Precautions Precautions: None Restrictions Weight Bearing Restrictions: No    Mobility  Bed Mobility Overal bed mobility: Modified Independent Bed Mobility: Supine to Sit     Supine to sit: Modified independent (Device/Increase time)     General bed mobility comments: HOB elevated; no use of bed rails  Transfers Overall transfer level: Modified independent Equipment used: None             General transfer comment: no assist needed with safe hand placement   Ambulation/Gait Ambulation/Gait assistance: Supervision Ambulation Distance (Feet): 300 Feet Assistive device:  (pushed O2 tank) Gait Pattern/deviations: Step-through pattern;Decreased stride length Gait velocity: decr Gait velocity interpretation: Below normal speed for age/gender General Gait Details: pt required 5 standing rest breaks throughout ambulation with 6L O2; SpO2 remained 90-97% throughout session; pt performing pursed lip breathing with vc for breathing through nose at times; pt unable to converse while ambulating   Stairs            Wheelchair Mobility    Modified Rankin (Stroke Patients Only)       Balance Overall balance assessment: Needs  assistance Sitting-balance support: No upper extremity supported Sitting balance-Leahy Scale: Normal     Standing balance support: No upper extremity supported Standing balance-Leahy Scale: Good                      Cognition Arousal/Alertness: Awake/alert Behavior During Therapy: WFL for tasks assessed/performed Overall Cognitive Status: Within Functional Limits for tasks assessed                 General Comments: Pt very talkative.    Exercises      General Comments        Pertinent Vitals/Pain Pain Assessment: No/denies pain    Home Living                      Prior Function            PT Goals (current goals can now be found in the care plan section) Acute Rehab PT Goals Patient Stated Goal: none stated PT Goal Formulation: With patient Time For Goal Achievement: 06/03/15 Potential to Achieve Goals: Good Progress towards PT goals: Progressing toward goals    Frequency  Min 3X/week    PT Plan Current plan remains appropriate    Co-evaluation             End of Session Equipment Utilized During Treatment: Oxygen;Other (comment) (pulse oximetry) Activity Tolerance: Treatment limited secondary to medical complications (Comment) (decr SpO2) Patient left: with call bell/phone within reach;Other (comment) (on [facility] with nurse tech aware)     Time: 4098-1191 PT Time Calculation (min) (ACUTE  ONLY): 33 min  Charges:  $Gait Training: 23-37 mins                    G Codes:      Derek MoundKellyn R Basia Mcginty Arles Rumbold, PTA Pager: [phone]   05/29/2015, 3:30 PM

## 2015-05-29 NOTE — Progress Notes (Signed)
Occupational Therapy Evaluation Patient Details Name: [name] MRN: [id number] DOB: Mar 10, 1956 Today's Date: 05/29/2015    History of Present Illness Pt adm with respiratory failure due to COPD exacerbation. Pt placed on BIPAP on admission.  PMH - severe COPD,    Clinical Impression   PTA, pt was independent with ADLs and functional mobility. Pt currently presents with severe SOB and decreased activity tolerance. SpO2 at rest was 86% and after 6 minutes of pursed lip breathing pt was able to increase it 93% before transferring to and from [facility]. SpO2 remained at 88% at rest while conversing with therapist. Educated pt on energy conservation strategies and availability of AE for LB ADLs. Pt will benefit from continued acute OT to increase independence and safety with ADLs and functional mobility to allow for safe d/c home with assistance from family.     Follow Up Recommendations  No OT follow up;Supervision - Intermittent    Equipment Recommendations  None recommended by OT    Recommendations for Other Services       Precautions / Restrictions Precautions Precautions: None Restrictions Weight Bearing Restrictions: No      Mobility Bed Mobility                  Transfers Overall transfer level: Modified independent Equipment used: None             General transfer comment: HOB elevated, no use of bedrails. No assist required    Balance Overall balance assessment: Needs assistance Sitting-balance support: No upper extremity supported;Feet supported Sitting balance-Leahy Scale: Good Sitting balance - Comments: Limited by severe SOB   Standing balance support: No upper extremity supported;During functional activity Standing balance-Leahy Scale: Good Standing balance comment: Limited by severe SOB                            ADL Overall ADL's : Needs assistance/impaired Eating/Feeding: Modified independent   Grooming: Wash/dry  Theatre stage manager: Supervision/safety;Ambulation;BSC   Toileting- Architect and Hygiene: Supervision/safety;Sit to/from stand         General ADL Comments: Pt reported his breathing his worse today pt was extremely SOB throughout entire session (with and without activity) on 5L of O2. Pt required 5 minute breaks x3 to regain breath and O2 sats above 88%. Sitting on BSC pt's O2 at 84% and pt able to increase O2 sats after 6 minutes to 93% before transferring back to the bed. O2 at 89% consistently when sitting in bed talking. Discussed energy conservation strategies and breath techniques (pt already using most of these daily). Pt monitors O2 regularly with Pulse oximeter and uses pursed lip breathing strategy.       Vision Vision Assessment?: No apparent visual deficits   Perception     Praxis      Pertinent Vitals/Pain Pain Assessment: No/denies pain     Hand Dominance Right   Extremity/Trunk Assessment Upper Extremity Assessment Upper Extremity Assessment: Overall WFL for tasks assessed   Lower Extremity Assessment Lower Extremity Assessment: Overall WFL for tasks assessed   Cervical / Trunk Assessment Cervical / Trunk Assessment: Normal   Communication Communication Communication: No difficulties   Cognition Arousal/Alertness: Awake/alert Behavior During Therapy: WFL for tasks assessed/performed Overall Cognitive Status: Within Functional Limits for tasks assessed  General Comments: Pt very SOB and unable to communicate much.   General Comments       Exercises       Shoulder Instructions      Home Living Family/patient expects to be discharged to:: Private residence Living Arrangements: Alone Available Help at Discharge: Family;Available PRN/intermittently Type of Home: House       Home Layout: One level     Bathroom Shower/Tub: Tub/shower  unit;Curtain Shower/tub characteristics: Engineer, building servicesCurtain Bathroom Toilet: Standard     Home Equipment: Environmental consultantWalker - 2 wheels;Cane - single point;Shower seat;Other (comment) (O2 and concentrator)   Additional Comments: Plans to d/c home or to pt's brother's home      Prior Functioning/Environment Level of Independence: Independent        Comments: Uses 5L of O2 at home. 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    OT Diagnosis: Generalized weakness;Other (comment) (severe SOB)   OT Problem List: Decreased strength;Decreased activity tolerance;Impaired balance (sitting and/or standing);Decreased safety awareness;Decreased knowledge of use of DME or AE;Cardiopulmonary status limiting activity   OT Treatment/Interventions: Self-care/ADL training;Therapeutic exercise;Energy conservation;DME and/or AE instruction;Therapeutic activities;Patient/family education;Balance training    OT Goals(Current goals can be found in the care plan section) Acute Rehab OT Goals Patient Stated Goal: To be able to start working out again OT Goal Formulation: With patient Time For Goal Achievement: 06/12/15 Potential to Achieve Goals: Fair ADL Goals Pt Will Perform Grooming: with modified independence;sitting Pt Will Perform Lower Body Bathing: with modified independence;with adaptive equipment;sitting/lateral leans Pt Will Perform Lower Body Dressing: with modified independence;sitting/lateral leans;sit to/from stand Pt Will Transfer to Toilet: with modified independence;ambulating;regular height toilet Pt Will Perform Toileting - Clothing Manipulation and hygiene: with modified independence;sit to/from stand Pt Will Perform Tub/Shower Transfer: Tub transfer;with modified independence;ambulating;shower seat Additional ADL Goal #1: Pt will independently demonstrate energy conservation strategies during ADL tasks.   OT Frequency: Min 2X/week   Barriers to D/C:             Co-evaluation              End of Session Nurse Communication: Mobility status;Other (comment) (Pt wanting to speak with RN)  Activity Tolerance: Treatment limited secondary to medical complications (Comment) (severe SOB) Patient left: in bed;with call bell/phone within reach   Time: 1030-1101 OT Time Calculation (min): 31 min Charges:  OT General Charges $OT Visit: 1 Procedure OT Evaluation $OT Eval Moderate Complexity: 1 Procedure OT Treatments $Self Care/Home Management : 8-22 mins G-Codes:    Nils PyleJulia Cutberto Winfree, OTR/L Pager: [phone] 05/29/2015, 11:30 AM

## 2015-05-30 MED ORDER — IPRATROPIUM-ALBUTEROL 0.5-2.5 (3) MG/3ML IN SOLN: 3.0000 mL | RESPIRATORY_TRACT | Status: DC | PRN

## 2015-05-30 MED ORDER — PREDNISONE 10 MG (21) PO TBPK: ORAL_TABLET | ORAL | Status: DC

## 2015-05-30 NOTE — Progress Notes (Signed)
[name] Dain to be D/C'd Home per MD order. Discussed with the patient and all questions fully answered.    Medication List    TAKE these medications        aspirin 81 MG tablet  Take 81 mg by mouth daily.     Fluticasone-Salmeterol 250-50 MCG/DOSE Aepb  Commonly known as:  ADVAIR  Inhale 1 puff into the lungs every 12 (twelve) hours.     ipratropium-albuterol 0.5-2.5 (3) MG/3ML Soln  Commonly known as:  DUONEB  Take 3 mLs by nebulization every 4 (four) hours as needed.     loratadine 10 MG tablet  Commonly known as:  CLARITIN  Take 10 mg by mouth daily as needed for allergies.     predniSONE 10 MG (21) Tbpk tablet  Commonly known as:  STERAPRED UNI-PAK 21 TAB  Take 6-5-4-3-2-1 tablets by mouth daily till gone.     PROAIR HFA 108 (90 Base) MCG/ACT inhaler  Generic drug:  albuterol  INHALE 1 TO 2 PUFFS ORALLY INTO THE LUNGS EVERY SIX HOURS AS NEEDED FOR WHEEZING OR SHORTNESS OF BREATH     sertraline 50 MG tablet  Commonly known as:  ZOLOFT  Take 50 mg by mouth daily.     SPIRIVA HANDIHALER 18 MCG inhalation capsule  Generic drug:  tiotropium  INHALE CONTENTS OF 1 CAPSULE THROUGH HANDIHALER DEVICE ONE TIME DAILY        VVS, Skin clean, dry and intact without evidence of skin break down, no evidence of skin tears noted.  IV catheter discontinued intact. Site without signs and symptoms of complications. Dressing and pressure applied.  An After Visit Summary was printed and given to the patient.  Patient escorted via WC, and D/C home via private auto.  [name], [name]  05/30/2015 7:49 PM

## 2015-05-30 NOTE — Discharge Summary (Signed)
Physician Discharge Summary  Alex CapuchinGeorge T Boyd WJX:[id number] DOB: 1955/10/16 DOA: 05/24/2015  PCP: [name], [name], MD  Admit date: 05/24/2015 Discharge date: 05/30/2015  Time spent: 35 minutes  Recommendations for Outpatient Follow-up:  1. Please follow-up on respiratory status, patient was admitted for COPD exacerbation. 2. He was set up with home nebulizer machine prior to discharge 3. Home oxygen continued   Discharge Diagnoses:  Active Problems:   Acute on chronic respiratory failure (HCC)   Acute on chronic respiratory failure with hypercapnia (HCC)   COPD exacerbation (HCC)   Lactic acid acidosis   Discharge Condition: Stable/improved  Diet recommendation: Healthy Heart  Filed Weights   05/27/15 0405 05/28/15 0424 05/28/15 2103  Weight: 47.7 kg (105 lb 2.6 oz) 48 kg (105 lb 13.1 oz) 48.7 kg (107 lb 5.8 oz)    History of present illness:  60 year old male, former smoker, with past medical history as below, which includes severe COPD followed by [facility] pulmonary [facility]. FEV1 known to be very low at 0.54 L (18% predicted). He is severely limited at baseline secondary to his COPD. He is chronically on 4-5L O2 va [place]. He most recently followed up with Dr. Delton CoombesByrum 01/2015 at which point he was deemed a candidate for long term social security disability. He is essentially unable to do ADLs and chores. 05/24/2015 [facility] in profound distress. He reported a 2-3 day history of SOB, non-productive cough, chest tightness, and generally "not feeling good". He endorsed subjective fevers as well. 1/4 dyspnea was significantly worse, he called pulmonary office and was prescribed prednisone, however, before he was able to pick this up he was too SOB even to speak, so he texted his uncle to call 911. Upon arrival to the ED he was noted to be in profound respiratory distress requiring the use of accessory muscles and had audible wheeze. He was also very lethargic with  documented GCS 9. He was started on BiPAP and had improvement in his lethargy. Lactic acid was elevated at 5, cardiac enzymes normal in ED. CXR with no acute findings. He was started on antibiotics and given nebulizer treatments. PCCM was asked to admit.   Hospital Course:  [name] is a pleasant 60 year old gentleman with a past medical history of advanced/end stage chronic obstructive pulmonary disease (FEV1 18% predicted), on 4 L of mental oxygen at baseline, admitted to the pulmonary service on 05/24/2015 when he presented with worsening shortness of breath, cough and chest tightness. On presentation he was found to be in acute on chronic hypoxemic respiratory failure and was placed on noninvasive positive pressure ventilation. He was given systemic steroids, nebulizer treatments, and antimicrobial therapy. Patient showing gradual clinical improvement. Flu swab was negative. Symptoms felt to be secondary to viral pneumonitis for which IV antibiotics were stopped. By 05/30/2015 he show significant clinical improvement. He expressed his wishes to be discharged today stating he felt he was at his baseline. Case was discussed with case manager regarding setting him up with home nebulizer machine. He was discharged on prednisone taper and DuoNeb's.  Consultations:  Pulmonary critical care medicine  Discharge Exam: Filed Vitals:   05/29/15 1250 05/29/15 1949  BP: 143/87 143/96  Pulse: 116 105  Temp: 97.6 F (36.4 C) 98.6 F (37 C)  Resp: 18 17   General: He appears dyspneic at rest however states this is his baseline. Chronically ill-appearing. Lungs: Diminished breath sounds bilaterally, no acute distress Cardiovascular: regular rate and rhythm - distant HS -  no appreciable M Abdomen: Nontender, nondistended, soft, bowel sounds positive, no rebound, no ascites Extremities: No significant cyanosis, clubbing, or edema bilateral lower extremities  Discharge Instructions   Discharge  Instructions    Call MD for:  difficulty breathing, headache or visual disturbances    Complete by:  As directed      Call MD for:  extreme fatigue    Complete by:  As directed      Call MD for:  hives    Complete by:  As directed      Call MD for:  persistant dizziness or light-headedness    Complete by:  As directed      Call MD for:  persistant nausea and vomiting    Complete by:  As directed      Call MD for:  redness, tenderness, or signs of infection (pain, swelling, redness, odor or green/yellow discharge around incision site)    Complete by:  As directed      Call MD for:  severe uncontrolled pain    Complete by:  As directed      Call MD for:  temperature >100.4    Complete by:  As directed      Call MD for:    Complete by:  As directed      Diet - low sodium heart healthy    Complete by:  As directed      Increase activity slowly    Complete by:  As directed           Current Discharge Medication List    START taking these medications   Details  ipratropium-albuterol (DUONEB) 0.5-2.5 (3) MG/3ML SOLN Take 3 mLs by nebulization every 4 (four) hours as needed. Qty: 360 mL, Refills: 6    predniSONE (STERAPRED UNI-PAK 21 TAB) 10 MG (21) TBPK tablet Take 6-5-4-3-2-1 tablets by mouth daily till gone. Qty: 21 tablet, Refills: 0      CONTINUE these medications which have NOT CHANGED   Details  aspirin 81 MG tablet Take 81 mg by mouth daily.    Fluticasone-Salmeterol (ADVAIR) 250-50 MCG/DOSE AEPB Inhale 1 puff into the lungs every 12 (twelve) hours. Qty: 3 each, Refills: 3    loratadine (CLARITIN) 10 MG tablet Take 10 mg by mouth daily as needed for allergies.    PROAIR HFA 108 (90 BASE) MCG/ACT inhaler INHALE 1 TO 2 PUFFS ORALLY INTO THE LUNGS EVERY SIX HOURS AS NEEDED FOR WHEEZING OR SHORTNESS OF BREATH Qty: 3 Inhaler, Refills: 1    sertraline (ZOLOFT) 50 MG tablet Take 50 mg by mouth daily.    SPIRIVA HANDIHALER 18 MCG inhalation capsule INHALE CONTENTS OF 1  CAPSULE THROUGH HANDIHALER DEVICE ONE TIME DAILY Qty: 90 capsule, Refills: 1       Allergies  Allergen Reactions  . Other Other (See Comments)    Patient claims he has a childhood allergy to "mycins."   Follow-up Information    Follow up with Leslye Peer., MD In 1 week.   Specialty:  Pulmonary Disease   Contact information:   [address] [place] [address] [phone]       Follow up with Pincus Sanes, MD In 1 week.   Specialty:  Internal Medicine   Contact information:   [address] [place] [address] [phone]        The results of significant diagnostics from this hospitalization (including imaging, microbiology, ancillary and laboratory) are listed below for reference.    Significant  Diagnostic Studies: Dg Chest Port 1 View  05/25/2015  CLINICAL DATA:  Acute chronic respiratory failure.  COPD. EXAM: PORTABLE CHEST - 1 VIEW COMPARISON:  One-view chest x-ray 05/24/2015. FINDINGS: The heart size is normal. Mild pulmonary vascular congestion has increased. Emphysematous changes are again noted. There no significant effusions. Atherosclerotic changes are again noted at the aortic arch. IMPRESSION: 1. Slight increase and mild pulmonary vascular congestion without significant effusions. 2. Emphysema. 3. Atherosclerosis in the thoracic aorta. Electronically Signed   By: Marin Roberts M.D.   On: 05/25/2015 06:53   Dg Chest Portable 1 View  05/24/2015  CLINICAL DATA:  Respiratory distress, 86% oxygen saturation on 4 L oxygen, former smoker, COPD EXAM: PORTABLE CHEST 1 VIEW COMPARISON:  Portable exam 1642 hours compared to 09/12/2014 FINDINGS: Normal heart size, mediastinal contours, and pulmonary vascularity. Atherosclerotic calcification aorta. Lungs emphysematous but clear. No pulmonary infiltrate, pleural effusion or pneumothorax. Bones unremarkable. IMPRESSION: No acute abnormalities. Electronically Signed   By: Ulyses Southward M.D.   On: 05/24/2015 16:50     Microbiology: Recent Results (from the past 240 hour(s))  MRSA PCR Screening     Status: Abnormal   Collection Time: 05/24/15  8:53 PM  Result Value Ref Range Status   MRSA by PCR POSITIVE (A) NEGATIVE Final    Comment:        The GeneXpert MRSA Assay (FDA approved for NASAL specimens only), is one component of a comprehensive MRSA colonization surveillance program. It is not intended to diagnose MRSA infection nor to guide or monitor treatment for MRSA infections. RESULT CALLED TO, READ BACK BY AND VERIFIED WITH: L WALSH @2323  05/24/15 MKELLY   Respiratory virus panel     Status: None   Collection Time: 05/24/15  9:18 PM  Result Value Ref Range Status   Respiratory Syncytial Virus A Negative Negative Final   Respiratory Syncytial Virus B Negative Negative Final   Influenza A Negative Negative Final   Influenza B Negative Negative Final   Parainfluenza 1 Negative Negative Final   Parainfluenza 2 Negative Negative Final   Parainfluenza 3 Negative Negative Final   Metapneumovirus Negative Negative Final   Rhinovirus Negative Negative Final   Adenovirus Negative Negative Final    Comment: (NOTE) Performed At: [facility] [address] [place], [place] [id number] Mila Homer MD WU:[id number]   Culture, blood (routine x 2)     Status: None   Collection Time: 05/24/15 11:12 PM  Result Value Ref Range Status   Specimen Description BLOOD RIGHT ANTECUBITAL  Final   Special Requests BOTTLES DRAWN AEROBIC AND ANAEROBIC 10CC   Final   Culture NO GROWTH 5 DAYS  Final   Report Status 05/29/2015 FINAL  Final  Culture, blood (routine x 2)     Status: None   Collection Time: 05/24/15 11:20 PM  Result Value Ref Range Status   Specimen Description BLOOD RIGHT FOREARM  Final   Special Requests BOTTLES DRAWN AEROBIC AND ANAEROBIC 5CC  Final   Culture NO GROWTH 5 DAYS  Final   Report Status 05/29/2015 FINAL  Final  Urine culture     Status: None   Collection Time:  05/25/15  2:30 AM  Result Value Ref Range Status   Specimen Description URINE, CLEAN CATCH  Final   Special Requests NONE  Final   Culture NO GROWTH 1 DAY  Final   Report Status 05/26/2015 FINAL  Final     Labs: Basic Metabolic Panel:  Recent Labs Lab 05/24/15 1612  05/24/15 2312 05/25/15 0330 05/26/15 0412 05/27/15 0330  NA 139 140 141 139 142  K 5.5* 4.4 4.7 4.7 4.4  CL 95* 101 100* 103 105  CO2 30 29 32 31 32  GLUCOSE 111* 192* 155* 109* 118*  BUN 27* 28* 29* 25* 24*  CREATININE 0.89 0.98 0.93 0.67 0.59*  CALCIUM 9.7 8.1* 8.5* 8.6* 8.7*  MG  --   --  2.3  --   --   PHOS  --   --  1.9* 3.1  --    Liver Function Tests:  Recent Labs Lab 05/24/15 1612  AST 53*  ALT 28  ALKPHOS 97  BILITOT 1.1  PROT 8.0  ALBUMIN 4.7   No results for input(s): LIPASE, AMYLASE in the last 168 hours. No results for input(s): AMMONIA in the last 168 hours. CBC:  Recent Labs Lab 05/24/15 1612 05/25/15 0330  WBC 16.8* 10.2  NEUTROABS 13.8*  --   HGB 17.9* 14.3  HCT 54.2* 43.7  MCV 99.4 98.4  PLT 191 151   Cardiac Enzymes:  Recent Labs Lab 05/24/15 1612  CKTOTAL 240   BNP: BNP (last 3 results)  Recent Labs  05/24/15 1612  BNP 32.7    ProBNP (last 3 results) No results for input(s): PROBNP in the last 8760 hours.  CBG:  Recent Labs Lab 05/24/15 2054 05/25/15 0813  GLUCAP 184* 110*       Signed:  Jeralyn Bennett MD.  [facility] 05/30/2015, 11:41 AM

## 2015-05-30 NOTE — Care Management Note (Signed)
Case Management Note  Patient Details  Name: Alex CapuchinGeorge T Boyd MRN: [id number] Date of Birth: 1956/01/17  Subjective/Objective:     Patient was admitted with COPD exacerbation              Action/Plan: Case manager requested  Nebulizer for home use per MD order.   Expected Discharge Date:  05/30/15              Expected Discharge Plan:  Home/Self Care  In-House Referral:     Discharge planning Services  CM Consult  Post Acute Care Choice:  Resumption of Svcs/PTA Provider, Durable Medical Equipment Choice offered to:     DME Arranged:  Nebulizer/meds, Nebulizer machine DME Agency:  [facility]  HH Arranged:  NA HH Agency:     Status of Service:  Completed, signed off  Medicare Important Message Given:    Date Medicare IM Given:    Medicare IM give by:    Date Additional Medicare IM Given:    Additional Medicare Important Message give by:     If discussed at Long Length of Stay Meetings, dates discussed:    Additional Comments:  Durenda GuthrieBrady, Matej Sappenfield Naomi, RN 05/30/2015, 11:16 AM

## 2015-05-30 NOTE — Progress Notes (Signed)
Occupational Therapy Treatment/Discharge Patient Details Name: [name] MRN: [id number] DOB: 1955/12/17 Today's Date: 05/30/2015    History of present illness Pt adm with respiratory failure due to COPD exacerbation. Pt placed on BIPAP on admission.  PMH - severe COPD,    OT comments  Pt progressing well and reported his breathing is better today. Reviewed energy conservation strategies and educated pt on availability of AE for LB ADLs. All education has been completed and pt has no further questions. Pt has no further acute OT needs. OT signing off.   Follow Up Recommendations  No OT follow up;Supervision - Intermittent    Equipment Recommendations  None recommended by OT    Recommendations for Other Services      Precautions / Restrictions Precautions Precautions: None Restrictions Weight Bearing Restrictions: No       Mobility Bed Mobility Overal bed mobility: Modified Independent             General bed mobility comments: HOB elevated  Transfers                      Balance                                   ADL Overall ADL's : Modified independent                                       General ADL Comments: Pt able to complete LB dressing with mod I. Educated pt on AE for days when his breathing is extremely difficult and he becomes easily fatigued. Reviewed energy conservation handout.       Vision                     Perception     Praxis      Cognition   Behavior During Therapy: WFL for tasks assessed/performed Overall Cognitive Status: Within Functional Limits for tasks assessed                       Extremity/Trunk Assessment               Exercises     Shoulder Instructions       General Comments      Pertinent Vitals/ Pain       Pain Assessment: No/denies pain  Home Living                                          Prior Functioning/Environment               Frequency       Progress Toward Goals  OT Goals(current goals can now be found in the care plan section)  Progress towards OT goals: Goals met/education completed, patient discharged from OT  Acute Rehab OT Goals Patient Stated Goal: to go home today OT Goal Formulation: With patient Time For Goal Achievement: 06/12/15 Potential to Achieve Goals: Good ADL Goals Pt Will Perform Grooming: with modified independence;sitting Pt Will Perform Lower Body Bathing: with modified independence;with adaptive equipment;sitting/lateral leans Pt Will Perform Lower Body Dressing: with modified independence;sitting/lateral leans;sit to/from stand Pt Will Transfer to Toilet: with modified independence;ambulating;regular height toilet Pt  Will Perform Toileting - Clothing Manipulation and hygiene: with modified independence;sit to/from stand Pt Will Perform Tub/Shower Transfer: Tub transfer;with modified independence;ambulating;shower seat Additional ADL Goal #1: Pt will independently demonstrate energy conservation strategies during ADL tasks.   Plan All goals met and education completed, patient discharged from OT services    Co-evaluation                 End of Session     Activity Tolerance Patient tolerated treatment well   Patient Left in bed;with call bell/phone within reach   Nurse Communication Mobility status        Time: 0721-8288 OT Time Calculation (min): 11 min  Charges:    Redmond Baseman, OTR/L Pager: [phone] 05/30/2015, 1:35 PM

## 2015-05-31 ENCOUNTER — Telehealth: Payer: Self-pay | Admitting: Emergency Medicine

## 2015-05-31 NOTE — Telephone Encounter (Signed)
[name] ° °

## 2015-06-06 ENCOUNTER — Ambulatory Visit (INDEPENDENT_AMBULATORY_CARE_PROVIDER_SITE_OTHER): Payer: BLUE CROSS/BLUE SHIELD | Admitting: Internal Medicine

## 2015-06-06 ENCOUNTER — Ambulatory Visit (INDEPENDENT_AMBULATORY_CARE_PROVIDER_SITE_OTHER): Payer: BLUE CROSS/BLUE SHIELD | Admitting: Emergency Medicine

## 2015-06-06 ENCOUNTER — Encounter: Payer: Self-pay | Admitting: Internal Medicine

## 2015-06-06 ENCOUNTER — Encounter: Payer: Self-pay | Admitting: Emergency Medicine

## 2015-06-06 VITALS — BP 144/94 | HR 120 | Temp 98.3°F | Resp 20 | Wt 105.0 lb

## 2015-06-06 VITALS — BP 134/66 | HR 86 | Ht 63.0 in | Wt 114.4 lb

## 2015-06-06 DIAGNOSIS — F419 Anxiety disorder, unspecified: Secondary | ICD-10-CM | POA: Diagnosis not present

## 2015-06-06 DIAGNOSIS — F329 Major depressive disorder, single episode, unspecified: Secondary | ICD-10-CM | POA: Insufficient documentation

## 2015-06-06 DIAGNOSIS — F32A Depression, unspecified: Secondary | ICD-10-CM | POA: Insufficient documentation

## 2015-06-06 DIAGNOSIS — J9622 Acute and chronic respiratory failure with hypercapnia: Secondary | ICD-10-CM | POA: Diagnosis not present

## 2015-06-06 DIAGNOSIS — J9611 Chronic respiratory failure with hypoxia: Secondary | ICD-10-CM | POA: Diagnosis not present

## 2015-06-06 DIAGNOSIS — J418 Mixed simple and mucopurulent chronic bronchitis: Secondary | ICD-10-CM | POA: Diagnosis not present

## 2015-06-06 MED ORDER — MUPIROCIN CALCIUM 2 % NA OINT: 1.0000 "application " | TOPICAL_OINTMENT | Freq: Two times a day (BID) | NASAL | Status: DC

## 2015-06-06 NOTE — Patient Instructions (Signed)
Please continue spiriva daily Finish your [name], and then go back to your Advair twice a day Please continue to use DuoNeb every 6 hours.  We will work on changing your oxygen back to small portable tanks, 4-5L/min pulsed system.  We will need to determine how much help you need at home, aides, nursing, etc. We discussed the pro's and con's of taking advantage of palliative care services. You may benefit from this - we will revisit it at our next visit.  Follow with Dr Delton Coombes in 2 months or sooner if you have any problems.

## 2015-06-06 NOTE — Assessment & Plan Note (Signed)
Due to severe COPD. Currently using 4 L/m continuous at rest, 4-5 L/m pulse with exertion

## 2015-06-06 NOTE — Patient Instructions (Addendum)
  Medications reviewed and updated.  No changes recommended at this time.  We will send your prescription to the pharmacy.    Please followup once a year or as needed.

## 2015-06-06 NOTE — Assessment & Plan Note (Signed)
Good discussion today with the patient and his father. He understands that the severity of his chronic illness. We talked about possible palliative care options. We also briefly talked about possible lung transplantation. It's unclear to me that he is a candidate for lung transplant but he may be able to tolerate. For now I'll would like to continue his current bronchodilators. We discussed possibly starting scheduled daily moderate dose prednisone and I think this would be an option for him in the future. We will follow up in 2 months and discuss all the issues above particularly possible referral for transplant or palliative care depending on how things progress.   Over 80% of this 30 minute visit was spent discussing the issues above and counseling regarding his chronic lung disease.
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
## 2015-06-06 NOTE — Progress Notes (Signed)
Subjective:    Patient ID: [name], male    DOB: Apr 15, 1956, 60 y.o.   MRN: [id number]  HPI 60 yo former smoker, 35 pk-yrs, with documented COPD. Followed by Dr Maxwell Caul and most recent FEV1 profoundly low at 0.54L (18% pred). On spiriva + symbicort 80. I met him originally in 2013 when he was admitted for an AE-COPD that required BiPAP. He is referred today to assess his meds and to discuss possible transplant referral at some point.    Functional capacity - he does have some limitations, no longer able to run, tough to swim in the ocean. He does stair w some difficulty. He is able to work. Has to rest some times.  Coughs occasionally, white mucous. Rare wheeze. Snores. No witnessed apneas. Uses SABA 2x week. Single AE since 2013.   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 Positive BD response, hyperinflation, decreased diffusion. We increased Symbicort to 160, continued spiriva. He has O2 at home that he uses at night, has started using using a bit with exertion but not reliably.    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 His a1-AT genotype was normal variant MZpratt, with a normal level 149. He is not smoking. Feels well. No flares since last time. Uses albuterol . Uses O2 at night, with some exertion.  > no change   08/02/13 Acute OV  Complains of 1 week of increased cough , dyspnea and wheezing . More DOE with walking.  Patient denies any hemoptysis, orthopnea, PND, or leg swelling. Appetite is good. No nausea, or vomiting.  Patient denies any fever.  Has been coughing up yellow stuff for the last week.  ROV 01/12/14 - hx severe COPD, here for f/u. He has been doing fairly well since last visit when he was treated for an AE. He is doing Symbicort and Spiriva.  Uses albuterol most days.   ROV 07/21/14 -- follow up for severe COPD, hypoxemia, last seen here in August. He has been up and down,  doing fairly well but is using O2 all the time now. He is having a bit more exertional SOB. We changed him to Advair from Symbicort for insurance reasons. He is using his albuterol daily (an increase). He is coughing some, not frequently productive except in the morning > white. Last AE was end of October.   ROV 09/28/14 -- follow-up visit for severe COPD, hypoxemic respiratory failure. Seen by Dr. Lenna Gilford  09/12/14 with an acute exacerbation that required steroids and antibiotics and that kept him out of work. It has since become clear that he may not be able to return to work due to his breathing limitations. We have filled paperwork for short-term disability. He has become much more limited with his activity. He is currently on Advair, Spiriva, albuterol prn.   ROV 11/03/14 -- follow up for severe COPD and chronic resp failure. He is on Advair, Spiriva, O2 at 4-5L/min. His lung disease has become progressively limiting and he is now on short-term disability.  He is hoping to find alternative work through the [employer]. There is paperwork for [place] to fill to define the hours that he can work and the kind of exertion he can tolerate. He is currently unable to do ADL's, cleaning, chores. He has to stop to rest after walking anywhere from 25-100 feet. It is  currently compensated state he is severely limited. Do not believe he'll be able return to work although he still has some hope for this  ROV 01/24/15 -- follow up for severe COPD, chronic hypoxemic resp failure. Her has been approved for long term social security disability. He remains quite limited, does try to exert with O2 on 4-5L/min. He has not had any flares. He does have baseline cough. He is interested in trying to volunteer at pulm rehab.    ROV 06/06/15 -- follow-up visit for chronic hypoxemic respiratory failure in the setting of severe COPD.  He was admitted to the hospital 1/4 through 1/10 for severe exacerbation  requiring BiPAP support. He slowly improved and was able to be discharged to complete prednisone.  He is staying with his brother, getting help. He is completing Dulera (from the hospital) and will plan to go back to Advair when it runs out. He would like to go back to portable O2 tanks, intermittent flow 4-5L/min. He needs to change from [employer] to [facility]. He is using duoneb q6h usually on a schedule. Also taking spiriva qd.  He has rarely needed pred in the past.       Objective:   Physical Exam Filed Vitals:   06/06/15 1624  BP: 134/66  Pulse: 86  Height: 5' 3"  (1.6 m)  Weight: 114 lb 6.4 oz (51.891 kg)  SpO2: 90%    Gen: Pleasant, thin male , in no distress,  normal affect  ENT: No lesions,  mouth clear,  oropharynx clear, no postnasal drip  Neck: No JVD, no TMG, no carotid bruits  Lungs: mild resp distress at all times, auto-PEEPing, extremely distant, no wheeze  Cardiovascular: RRR, heart sounds normal, no murmur or gallops, no peripheral edema  Musculoskeletal: No deformities, no cyanosis or clubbing  Neuro: alert, non focal  Skin: Warm, no lesions or rashes     Assessment & Plan:   Chronic respiratory failure (HCC) Due to severe COPD. Currently using 4 L/m continuous at rest, 4-5 L/m pulse with exertion  COPD (chronic obstructive pulmonary disease) Good discussion today with the patient and his father. He understands that the severity of his chronic illness. We talked about possible palliative care options. We also briefly talked about possible lung transplantation. It's unclear to me that he is a candidate for lung transplant but he may be able to tolerate. For now I'll would like to continue his current bronchodilators. We discussed possibly starting scheduled daily moderate dose prednisone and I think this would be an option for him in the future. We will follow up in 2 months and discuss all the issues above particularly possible referral for transplant or palliative  care depending on how things progress.   Over 80% of this 30 minute visit was spent discussing the issues above and counseling regarding his chronic lung disease.

## 2015-06-06 NOTE — Progress Notes (Signed)
Pre visit review using our clinic review tool, if applicable. No additional management support is needed unless otherwise documented below in the visit note. 

## 2015-06-06 NOTE — Assessment & Plan Note (Signed)
Well-controlled, stable Continue current dose of sertraline at 50 mg daily

## 2015-06-08 ENCOUNTER — Telehealth: Payer: Self-pay | Admitting: Emergency Medicine

## 2015-06-08 MED ORDER — ALBUTEROL SULFATE HFA 108 (90 BASE) MCG/ACT IN AERS: INHALATION_SPRAY | RESPIRATORY_TRACT | Status: DC

## 2015-06-08 MED ORDER — TIOTROPIUM BROMIDE MONOHYDRATE 18 MCG IN CAPS: ORAL_CAPSULE | RESPIRATORY_TRACT | Status: DC

## 2015-06-08 NOTE — Telephone Encounter (Signed)
Informed pt his insurance uses [employer]. Sent in refills requested by pt.

## 2015-06-12 ENCOUNTER — Telehealth: Payer: Self-pay | Admitting: Emergency Medicine

## 2015-06-12 DIAGNOSIS — J449 Chronic obstructive pulmonary disease, unspecified: Secondary | ICD-10-CM

## 2015-06-12 NOTE — Telephone Encounter (Signed)
ATC line fast busy signal x 3 wcb 

## 2015-06-13 NOTE — Telephone Encounter (Signed)
[phone], pt cb

## 2015-06-13 NOTE — Telephone Encounter (Signed)
Spoke with pt, has been using a poc since march 2016, wants to switch to small compressed 02 tanks.  Pt uses [employer].  Pt wears 4lpm at rest, 5lpm with activity (pulse dose).   RB are you ok with this?  Thanks!

## 2015-06-13 NOTE — Telephone Encounter (Signed)
lmtcb for pt.  

## 2015-06-15 ENCOUNTER — Other Ambulatory Visit: Payer: Self-pay

## 2015-06-15 MED ORDER — SERTRALINE HCL 50 MG PO TABS: 50.0000 mg | ORAL_TABLET | Freq: Every day | ORAL | Status: DC

## 2015-06-15 NOTE — Telephone Encounter (Signed)
Rx request for Sertraline, medication never previously prescribed. Please advise on refill, thanks

## 2015-06-15 NOTE — Telephone Encounter (Signed)
Yes I'm Ok with this

## 2015-06-15 NOTE — Telephone Encounter (Signed)
rx sent to [facility]

## 2015-06-16 ENCOUNTER — Telehealth: Payer: Self-pay | Admitting: Emergency Medicine

## 2015-06-16 ENCOUNTER — Other Ambulatory Visit: Payer: Self-pay | Admitting: *Deleted

## 2015-06-16 MED ORDER — IPRATROPIUM-ALBUTEROL 0.5-2.5 (3) MG/3ML IN SOLN: 3.0000 mL | RESPIRATORY_TRACT | Status: AC | PRN

## 2015-06-16 NOTE — Telephone Encounter (Signed)
Patient calling because he received his O2 tanks today and was given a continuous regulator instead of Pulse regulator.  They also did not pick up his POC tank. [name], spoke with [name] and he advised me that they erred in that they read the order incorrectly and they are going to contact the patient to find out when he will be home so they can go out and pick up POC tank and provide patient with correct regulator.  Called and advised patient that [name] will be calling him to come out to fix the problem.  Patient appreciative. Nothing further needed.

## 2015-06-16 NOTE — Telephone Encounter (Signed)
Pt called and [name] explained that the order was placed to swap out POC for portable tanks. Pt was advised that [name] will be calling him to set up switching them out. No further questions.

## 2015-06-16 NOTE — Telephone Encounter (Signed)
Order entered for change to compressed o2 tanks. Attempted to contact patient, left message to call back.

## 2015-07-03 ENCOUNTER — Telehealth: Payer: Self-pay | Admitting: Emergency Medicine

## 2015-07-03 MED ORDER — FLUTICASONE-SALMETEROL 250-50 MCG/DOSE IN AEPB: 1.0000 | INHALATION_SPRAY | Freq: Two times a day (BID) | RESPIRATORY_TRACT | Status: DC

## 2015-07-03 NOTE — Telephone Encounter (Signed)
Per 06/06/15 OV: Patient Instructions       Please continue spiriva daily Finish your [place], and then go back to your Advair twice a day Please continue to use DuoNeb every 6 hours.   We will work on changing your oxygen back to small portable tanks, 4-5L/min pulsed system.   We will need to determine how much help you need at home, aides, nursing, etc. We discussed the pro's and con's of taking advantage of palliative care services. You may benefit from this - we will revisit it at our next visit.  Follow with Dr Delton Coombes in 2 months or sooner if you have any problems.  ----   Called spoke with pt. He has finished the dulera. He is needing his advair sent into [facility]. I have done so. Nothing further needed

## 2015-07-13 ENCOUNTER — Telehealth: Payer: Self-pay | Admitting: Emergency Medicine

## 2015-07-13 MED ORDER — AZITHROMYCIN 250 MG PO TABS: ORAL_TABLET | ORAL | Status: DC

## 2015-07-13 NOTE — Telephone Encounter (Signed)
Called spoke with pt and is aware of recs. RX has been called in for ABX

## 2015-07-13 NOTE — Telephone Encounter (Signed)
Offer Zpak and suggest Mucinex-DM  Encourage fluids

## 2015-07-13 NOTE — Telephone Encounter (Signed)
Called spoke with pt. He reports x yesterday he had fever of 100.8 and was feeling bad.  He started ibuprofen 2 q4hrs. No fever now. C/o prod cough (yellow phlem). Denies any wheezing, and no chest tx, SOB stable.  Wants something called in.  RB out sick today. Will forward to DOD, please advise Dr. Maple Hudson thanks  Allergies  Allergen Reactions  . Other Other (See Comments)    Patient claims he has a childhood allergy to "mycins."     Current Outpatient Prescriptions on File Prior to Visit  Medication Sig Dispense Refill  . albuterol (PROAIR HFA) 108 (90 Base) MCG/ACT inhaler INHALE 1 TO 2 PUFFS ORALLY INTO THE LUNGS EVERY SIX HOURS AS NEEDED FOR WHEEZING OR SHORTNESS OF BREATH 3 Inhaler 2  . aspirin 81 MG tablet Take 81 mg by mouth daily.    . Fluticasone-Salmeterol (ADVAIR) 250-50 MCG/DOSE AEPB Inhale 1 puff into the lungs every 12 (twelve) hours. 3 each 3  . ipratropium-albuterol (DUONEB) 0.5-2.5 (3) MG/3ML SOLN Take 3 mLs by nebulization every 4 (four) hours as needed. 1620 mL 1  . loratadine (CLARITIN) 10 MG tablet Take 10 mg by mouth daily as needed for allergies.    . mupirocin nasal ointment (BACTROBAN NASAL) 2 % Place 1 application into the nose 2 (two) times daily. Use one-half of tube in each nostril twice daily for five (5) days. After application, press sides of nose together and gently massage. 10 g 0  . sertraline (ZOLOFT) 50 MG tablet Take 1 tablet (50 mg total) by mouth daily. 90 tablet 1  . tiotropium (SPIRIVA HANDIHALER) 18 MCG inhalation capsule INHALE CONTENTS OF 1 CAPSULE THROUGH HANDIHALER DEVICE ONE TIME DAILY 90 capsule 2   No current facility-administered medications on file prior to visit.

## 2015-08-18 ENCOUNTER — Encounter: Payer: Self-pay | Admitting: Emergency Medicine

## 2015-08-18 ENCOUNTER — Ambulatory Visit (INDEPENDENT_AMBULATORY_CARE_PROVIDER_SITE_OTHER): Payer: BLUE CROSS/BLUE SHIELD | Admitting: Emergency Medicine

## 2015-08-18 VITALS — BP 112/80 | HR 111 | Ht 63.0 in | Wt 115.0 lb

## 2015-08-18 DIAGNOSIS — J418 Mixed simple and mucopurulent chronic bronchitis: Secondary | ICD-10-CM

## 2015-08-18 NOTE — Patient Instructions (Signed)
Please continue your Advair and Spiriva as you have been taking them  Use DuoNeb as needed Continue your oxygen 4-5L/min at all times.  We can refer you [facility] in your Insurance Network if needed, although we will be happy to work with any agency that you prefer. Please let us know if we need to make a new referral.  Follow with Dr Delton CoombesByrum in 4 months or sooner if you have any problems.

## 2015-08-18 NOTE — Progress Notes (Signed)
  Subjective:    Patient ID: [name], male    DOB: 08/29/1955, 60 y.o.   MRN: [id number]  HPI 60 yo former smoker, 35 pk-yrs, with documented COPD. Followed by Dr Maxwell Caul and most recent FEV1 profoundly low at 0.54L (18% pred). On spiriva + symbicort 80. I met him originally in 2013 when he was admitted for an AE-COPD that required BiPAP. He is referred today to assess his meds and to discuss possible transplant referral at some point.    Functional capacity - he does have some limitations, no longer able to run, tough to swim in the ocean. He does stair w some difficulty. He is able to work. Has to rest some times.  Coughs occasionally, white mucous. Rare wheeze. Snores. No witnessed apneas. Uses SABA 2x week. Single AE since 2013.    ROV 06/06/15 -- follow-up visit for chronic hypoxemic respiratory failure in the setting of severe COPD.  He was admitted to the hospital 1/4 through 1/10 for severe exacerbation requiring BiPAP support. He slowly improved and was able to be discharged to complete prednisone.  He is staying with his brother, getting help. He is completing Dulera (from the hospital) and will plan to go back to Advair when it runs out. He would like to go back to portable O2 tanks, intermittent flow 4-5L/min. He needs to change from [employer] to [facility]. He is using duoneb q6h usually on a schedule. Also taking spiriva qd.  He has rarely needed pred in the past.   ROV 08/18/15 -- Patient with a history of severe COPD, chronic hypoxemic respiratory failure. He has been doing fairly well, but notes that he doesn't get out as much now days. He is now back to Advair + Spiriva, DuoNeb q6h. He has some cough, improved compared with 2/23 when he was treated as outpt for an AE-COPD. He is about to start getting Palliative Care support and benefits, but he is apparently out of network with them. He may need a referral elsewhere.     Objective:   Physical Exam Filed Vitals:   08/18/15 1513  BP:  112/80  Pulse: 111  Height: '5\' 3"'$  (1.6 m)  Weight: 115 lb (52.164 kg)  SpO2: 95%    Gen: Pleasant, thin male , in no distress,  normal affect  ENT: No lesions,  mouth clear,  oropharynx clear, no postnasal drip  Neck: No JVD, no TMG, no carotid bruits  Lungs: mild resp distress at all times, auto-PEEPing, extremely distant, no wheeze  Cardiovascular: RRR, heart sounds normal, no murmur or gallops, no peripheral edema  Musculoskeletal: No deformities, no cyanosis or clubbing  Neuro: alert, non focal  Skin: Warm, no lesions or rashes     Assessment & Plan:   COPD (chronic obstructive pulmonary disease) Please continue your Advair and Spiriva as you have been taking them  Use DuoNeb as needed Continue your oxygen 4-5L/min at all times.  We can refer you [facility] if needed, although we will be happy to work with any agency that you prefer. Please let us know if we need to make a new referral.  Follow with Dr Lamonte Sakai in 4 months or sooner if you have any problems.

## 2015-08-18 NOTE — Assessment & Plan Note (Signed)
Please continue your Advair and Spiriva as you have been taking them  Use DuoNeb as needed Continue your oxygen 4-5L/min at all times.  We can refer you [facility] if needed, although we will be happy to work with any agency that you prefer. Please let us know if we need to make a new referral.  Follow with Dr Byrum in 4 months or sooner if you have any problems.  

## 2015-12-08 ENCOUNTER — Ambulatory Visit (INDEPENDENT_AMBULATORY_CARE_PROVIDER_SITE_OTHER): Payer: BLUE CROSS/BLUE SHIELD | Admitting: Emergency Medicine

## 2015-12-08 ENCOUNTER — Encounter: Payer: Self-pay | Admitting: Emergency Medicine

## 2015-12-08 ENCOUNTER — Telehealth: Payer: Self-pay | Admitting: Emergency Medicine

## 2015-12-08 VITALS — BP 142/98 | HR 102 | Ht 63.0 in | Wt 111.8 lb

## 2015-12-08 DIAGNOSIS — J9611 Chronic respiratory failure with hypoxia: Secondary | ICD-10-CM

## 2015-12-08 DIAGNOSIS — J418 Mixed simple and mucopurulent chronic bronchitis: Secondary | ICD-10-CM

## 2015-12-08 NOTE — Patient Instructions (Addendum)
It is nice to meet you. Continue your Spiriva and Advair as you have been doing. Rinse mouth with water after use. Continue your Duo_Nebs every 6 hours as needed. Continue with Palliative care.  Flu shot in October. Follow up with Dr. Delton CoombesByrum in 4 months. Please contact office for sooner follow up if symptoms do not improve or worsen or seek emergency care.

## 2015-12-08 NOTE — Assessment & Plan Note (Signed)
Continue oxygen at 4L at rest and 5L with exertion.
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
## 2015-12-08 NOTE — Assessment & Plan Note (Signed)
Stable COPD No exacerbations since 05/2015 Plan: Continue your Spiriva and Advair as you have been doing. Rinse mouth with water after use. Continue your Duo_Nebs every 6 hours as needed. Continue with Palliative care.  Flu shot in October. Follow up with Dr. Delton CoombesByrum in 4 months. Please contact office for sooner follow up if symptoms do not improve or worsen or seek emergency care.

## 2015-12-08 NOTE — Telephone Encounter (Signed)
Called spoke with pt.  He states that since the air quality is at a code orange he wasn't sure if he should come to his appointment today with RB. He denies any difficulty breathing and states that he does feel up to the trip. He just wanted clarification. I explained to him that if he felt his breathing was doing well that he could keep the appointment or cancel and I could reschedule him. Pt decided to keep ov. He voiced understanding and had no further questions.

## 2015-12-15 ENCOUNTER — Ambulatory Visit: Payer: BLUE CROSS/BLUE SHIELD | Admitting: Emergency Medicine

## 2015-12-26 ENCOUNTER — Other Ambulatory Visit: Payer: Self-pay | Admitting: Internal Medicine

## 2016-01-08 ENCOUNTER — Other Ambulatory Visit: Payer: Self-pay | Admitting: Internal Medicine

## 2016-04-17 ENCOUNTER — Encounter: Payer: Self-pay | Admitting: Emergency Medicine

## 2016-04-17 ENCOUNTER — Ambulatory Visit (INDEPENDENT_AMBULATORY_CARE_PROVIDER_SITE_OTHER): Payer: BLUE CROSS/BLUE SHIELD | Admitting: Emergency Medicine

## 2016-04-17 DIAGNOSIS — J418 Mixed simple and mucopurulent chronic bronchitis: Secondary | ICD-10-CM | POA: Diagnosis not present

## 2016-04-17 NOTE — Assessment & Plan Note (Signed)
Very limited but stable. We talked some about getting back to the maintenance program for pulm rehab. He is going to work on building his stamina.   Please continue your Spiriva and Advair  Take albuterol 2 puffs up to every 4 hours if needed for shortness of breath.  Wear your oxygen at 4-5L/min as you have been using it.  Follow with Dr Delton CoombesByrum in 4 months or sooner if you have any problems.

## 2016-04-17 NOTE — Progress Notes (Signed)
Subjective:    Patient ID: [name], male    DOB: 1955-06-17, 60 y.o.   MRN: [id number]  HPI 60 yo former smoker, 35 pk-yrs, with documented COPD. Followed by Dr Maxwell Caul and most recent FEV1 profoundly low at 0.54L (18% pred). On spiriva + symbicort 80. I met him originally in 2013 when he was admitted for an AE-COPD that required BiPAP. He is referred today to assess his meds and to discuss possible transplant referral at some point.    Functional capacity - he does have some limitations, no longer able to run, tough to swim in the ocean. He does stair w some difficulty. He is able to work. Has to rest some times.  Coughs occasionally, white mucous. Rare wheeze. Snores. No witnessed apneas. Uses SABA 2x week. Single AE since 2013.    ROV 06/06/15 -- follow-up visit for chronic hypoxemic respiratory failure in the setting of severe COPD.  He was admitted to the hospital 1/4 through 1/10 for severe exacerbation requiring BiPAP support. He slowly improved and was able to be discharged to complete prednisone.  He is staying with his brother, getting help. He is completing Dulera (from the hospital) and will plan to go back to Advair when it runs out. He would like to go back to portable O2 tanks, intermittent flow 4-5L/min. He needs to change from [employer] to [employer]. He is using duoneb q6h usually on a schedule. Also taking spiriva qd.  He has rarely needed pred in the past.   ROV 08/18/15 -- Patient with a history of severe COPD, chronic hypoxemic respiratory failure. He has been doing fairly well, but notes that he doesn't get out as much now days. He is now back to Advair + Spiriva, DuoNeb q6h. He has some cough, improved compared with 2/23 when he was treated as outpt for an AE-COPD. He is about to start getting Palliative Care support and benefits, but he is apparently out of network with them. He may need a referral elsewhere.   ROV 04/17/16 -- Follow-up visit for chronic hypoxemic respiratory  failure in the setting severe COPD. I last saw him in July 2017.  He tells me that he has been doing well - has expected ups and downs with his breathing. No flares since last time. He is maintained on spiriva and Advair, wears O2 at 4-5L/min. He uses proair about 7 times a week but not every day. Having some cough, clears mucous in the am.     Objective:   Physical Exam Vitals:   04/17/16 1353  BP: 122/80  BP Location: Left Arm  Cuff Size: Normal  Pulse: (!) 122  SpO2: 95%  Weight: 117 lb (53.1 kg)  Height: 5' 3"  (1.6 m)    Gen: Pleasant, thin male , in no distress,  normal affect  ENT: No lesions,  mouth clear,  oropharynx clear, no postnasal drip  Neck: No JVD, no TMG, no carotid bruits  Lungs: mild resp distress at all times, auto-PEEPing, extremely distant, no wheeze  Cardiovascular: RRR, heart sounds normal, no murmur or gallops, no peripheral edema  Musculoskeletal: No deformities, no cyanosis or clubbing  Neuro: alert, non focal  Skin: Warm, no lesions or rashes     Assessment & Plan:   COPD (chronic obstructive pulmonary disease) Very limited but stable. We talked some about getting back to the maintenance program for pulm rehab. He is going to work on building his stamina.   Please continue your Spiriva and Advair  Take albuterol 2 puffs up to every 4 hours if needed for shortness of breath.  Wear your oxygen at 4-5L/min as you have been using it.  Follow with Dr Lamonte Sakai in 4 months or sooner if you have any problems.  Baltazar Apo, MD, PhD 04/17/2016, 2:20 PM [facility] [phone] or if no answer [phone]

## 2016-04-17 NOTE — Patient Instructions (Signed)
Please continue your Spiriva and Advair  Take albuterol 2 puffs up to every 4 hours if needed for shortness of breath.  Wear your oxygen at 4-5L/min as you have been using it.  Follow with Dr Delton CoombesByrum in 4 months or sooner if you have any problems.

## 2016-05-10 ENCOUNTER — Telehealth: Payer: Self-pay | Admitting: Emergency Medicine

## 2016-05-10 MED ORDER — BUDESONIDE-FORMOTEROL FUMARATE 160-4.5 MCG/ACT IN AERO: 2.0000 | INHALATION_SPRAY | Freq: Two times a day (BID) | RESPIRATORY_TRACT | 0 refills | Status: AC

## 2016-05-10 NOTE — Telephone Encounter (Signed)
Ok for one sample of symbicort 160

## 2016-05-10 NOTE — Telephone Encounter (Signed)
Called and spoke with pt and he is aware of the symbicort sample that has been left up front and he will come by today to pick this up.  He requested that this be brought out to his car as he is unable to come in to get it.  Advised that he would have to call up front to let them know when he is here and we could have someone bring it down to him.

## 2016-05-10 NOTE — Telephone Encounter (Signed)
Called and spoke with pt and he stated that  He is out of the advair and this will not be sent to him until 7-10 days. We have no samples of the 250 advair but we do have the 100 and the 500.  Pt is requesting a sample of the symbicort.  He stated that he has used this in the past as well.  Pt is aware that RB is out of the office, so we will send to DOD to address. MW please advise. thanks

## 2016-06-09 DIAGNOSIS — R739 Hyperglycemia, unspecified: Secondary | ICD-10-CM | POA: Insufficient documentation
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
## 2016-06-10 ENCOUNTER — Other Ambulatory Visit (INDEPENDENT_AMBULATORY_CARE_PROVIDER_SITE_OTHER): Payer: BLUE CROSS/BLUE SHIELD

## 2016-06-10 ENCOUNTER — Encounter: Payer: Self-pay | Admitting: Internal Medicine

## 2016-06-10 ENCOUNTER — Ambulatory Visit (INDEPENDENT_AMBULATORY_CARE_PROVIDER_SITE_OTHER): Payer: BLUE CROSS/BLUE SHIELD | Admitting: Internal Medicine

## 2016-06-10 VITALS — BP 152/98 | HR 111 | Temp 98.2°F | Resp 16 | Wt 117.0 lb

## 2016-06-10 DIAGNOSIS — R739 Hyperglycemia, unspecified: Secondary | ICD-10-CM

## 2016-06-10 DIAGNOSIS — Z1159 Encounter for screening for other viral diseases: Secondary | ICD-10-CM

## 2016-06-10 DIAGNOSIS — Z23 Encounter for immunization: Secondary | ICD-10-CM

## 2016-06-10 DIAGNOSIS — Z0000 Encounter for general adult medical examination without abnormal findings: Secondary | ICD-10-CM | POA: Diagnosis not present

## 2016-06-10 DIAGNOSIS — F419 Anxiety disorder, unspecified: Secondary | ICD-10-CM

## 2016-06-10 LAB — CBC WITH DIFFERENTIAL/PLATELET
BASOS ABS: 0 10*3/uL (ref 0.0–0.1)
Basophils Relative: 0.4 % (ref 0.0–3.0)
EOS ABS: 0.2 10*3/uL (ref 0.0–0.7)
Eosinophils Relative: 1.9 % (ref 0.0–5.0)
HEMATOCRIT: 47.6 % (ref 39.0–52.0)
HEMOGLOBIN: 16.4 g/dL (ref 13.0–17.0)
LYMPHS PCT: 12.2 % (ref 12.0–46.0)
Lymphs Abs: 1.1 10*3/uL (ref 0.7–4.0)
MCHC: 34.5 g/dL (ref 30.0–36.0)
MCV: 98.3 fl (ref 78.0–100.0)
Monocytes Absolute: 0.8 10*3/uL (ref 0.1–1.0)
Monocytes Relative: 8.5 % (ref 3.0–12.0)
Neutro Abs: 7.2 10*3/uL (ref 1.4–7.7)
Neutrophils Relative %: 77 % (ref 43.0–77.0)
Platelets: 233 10*3/uL (ref 150.0–400.0)
RBC: 4.84 Mil/uL (ref 4.22–5.81)
RDW: 14 % (ref 11.5–15.5)
WBC: 9.4 10*3/uL (ref 4.0–10.5)

## 2016-06-10 LAB — LIPID PANEL
CHOLESTEROL: 210 mg/dL — AB (ref 0–200)
HDL: 90.7 mg/dL (ref >39.00)
LDL Cholesterol: 107 mg/dL — ABNORMAL HIGH (ref 0–99)
NonHDL: 119.24
TRIGLYCERIDES: 59 mg/dL (ref 0.0–149.0)
Total CHOL/HDL Ratio: 2
VLDL: 11.8 mg/dL (ref 0.0–40.0)

## 2016-06-10 LAB — TSH: TSH: 2.21 u[IU]/mL (ref 0.35–4.50)

## 2016-06-10 LAB — COMPREHENSIVE METABOLIC PANEL
ALBUMIN: 4.6 g/dL (ref 3.5–5.2)
ALK PHOS: 129 U/L — AB (ref 39–117)
ALT: 82 U/L — AB (ref 0–53)
AST: 78 U/L — AB (ref 0–37)
BILIRUBIN TOTAL: 0.6 mg/dL (ref 0.2–1.2)
BUN: 17 mg/dL (ref 6–23)
CALCIUM: 9.8 mg/dL (ref 8.4–10.5)
CO2: 30 mEq/L (ref 19–32)
CREATININE: 0.72 mg/dL (ref 0.40–1.50)
Chloride: 100 mEq/L (ref 96–112)
GFR: 117.64 mL/min (ref >60.00)
Glucose, Bld: 95 mg/dL (ref 70–99)
Potassium: 4.3 mEq/L (ref 3.5–5.1)
Sodium: 138 mEq/L (ref 135–145)
TOTAL PROTEIN: 8 g/dL (ref 6.0–8.3)

## 2016-06-10 LAB — HEMOGLOBIN A1C: HEMOGLOBIN A1C: 5.2 % (ref 4.6–6.5)

## 2016-06-10 MED ORDER — SERTRALINE HCL 100 MG PO TABS: 100.0000 mg | ORAL_TABLET | Freq: Every day | ORAL | 3 refills | Status: AC

## 2016-06-10 NOTE — Assessment & Plan Note (Signed)
Check a1c 

## 2016-06-10 NOTE — Progress Notes (Signed)
Pre visit review using our clinic review tool, if applicable. No additional management support is needed unless otherwise documented below in the visit note. 

## 2016-06-10 NOTE — Patient Instructions (Addendum)
Call your insurance about cologuard.  Let us know if it is covered.    Test(s) ordered today. Your results will be released to MyChart (or called to you) after review, usually within 72hours after test completion. If any changes need to be made, you will be notified at that same time.  All other Health Maintenance issues reviewed.   All recommended immunizations and age-appropriate screenings are up-to-date or discussed.  tetanus immunization administered today.   Medications reviewed and updated.  Changes include increasing sertraline to 100 mg daily.   Your prescription(s) have been submitted to your pharmacy. Please take as directed and contact our office if you believe you are having problem(s) with the medication(s).   Please followup in 6 months  Health Maintenance, Male A healthy lifestyle and preventative care can promote health and wellness.  Maintain regular health, dental, and eye exams.  Eat a healthy diet. Foods like vegetables, fruits, whole grains, low-fat dairy products, and lean protein foods contain the nutrients you need and are low in calories. Decrease your intake of foods high in solid fats, added sugars, and salt. Get information about a proper diet from your health care provider, if necessary.  Regular physical exercise is one of the most important things you can do for your health. Most adults should get at least 150 minutes of moderate-intensity exercise (any activity that increases your heart rate and causes you to sweat) each week. In addition, most adults need muscle-strengthening exercises on 2 or more days a week.   Maintain a healthy weight. The body mass index (BMI) is a screening tool to identify possible weight problems. It provides an estimate of body fat based on height and weight. Your health care provider can find your BMI and can help you achieve or maintain a healthy weight. For males 20 years and older:  A BMI below 18.5 is considered  underweight.  A BMI of 18.5 to 24.9 is normal.  A BMI of 25 to 29.9 is considered overweight.  A BMI of 30 and above is considered obese.  Maintain normal blood lipids and cholesterol by exercising and minimizing your intake of saturated fat. Eat a balanced diet with plenty of fruits and vegetables. Blood tests for lipids and cholesterol should begin at age 91 and be repeated every 5 years. If your lipid or cholesterol levels are high, you are over age 29, or you are at high risk for heart disease, you may need your cholesterol levels checked more frequently.Ongoing high lipid and cholesterol levels should be treated with medicines if diet and exercise are not working.  If you smoke, find out from your health care provider how to quit. If you do not use tobacco, do not start.  Lung cancer screening is recommended for adults aged 55-80 years who are at high risk for developing lung cancer because of a history of smoking. A yearly low-dose CT scan of the lungs is recommended for people who have at least a 30-pack-year history of smoking and are current smokers or have quit within the past 15 years. A pack year of smoking is smoking an average of 1 pack of cigarettes a day for 1 year (for example, a 30-pack-year history of smoking could mean smoking 1 pack a day for 30 years or 2 packs a day for 15 years). Yearly screening should continue until the smoker has stopped smoking for at least 15 years. Yearly screening should be stopped for people who develop a health problem that would  prevent them from having lung cancer treatment.  If you choose to drink alcohol, do not have more than 2 drinks per day. One drink is considered to be 12 oz (360 mL) of beer, 5 oz (150 mL) of wine, or 1.5 oz (45 mL) of liquor.  Avoid the use of street drugs. Do not share needles with anyone. Ask for help if you need support or instructions about stopping the use of drugs.  High blood pressure causes heart disease and  increases the risk of stroke. High blood pressure is more likely to develop in:  People who have blood pressure in the end of the normal range (100-139/85-89 mm Hg).  People who are overweight or obese.  People who are African American.  If you are 6918-62 years of age, have your blood pressure checked every 3-5 years. If you are 440 years of age or older, have your blood pressure checked every year. You should have your blood pressure measured twice-once when you are at a hospital or clinic, and once when you are not at a hospital or clinic. Record the average of the two measurements. To check your blood pressure when you are not at a hospital or clinic, you can use:  An automated blood pressure machine at a pharmacy.  A home blood pressure monitor.  If you are 2145-397 years old, ask your health care provider if you should take aspirin to prevent heart disease.  Diabetes screening involves taking a blood sample to check your fasting blood sugar level. This should be done once every 3 years after age 62 if you are at a normal weight and without risk factors for diabetes. Testing should be considered at a younger age or be carried out more frequently if you are overweight and have at least 1 risk factor for diabetes.  Colorectal cancer can be detected and often prevented. Most routine colorectal cancer screening begins at the age of 62 and continues through age 62. However, your health care provider may recommend screening at an earlier age if you have risk factors for colon cancer. On a yearly basis, your health care provider may provide home test kits to check for hidden blood in the stool. A small camera at the end of a tube may be used to directly examine the colon (sigmoidoscopy or colonoscopy) to detect the earliest forms of colorectal cancer. Talk to your health care provider about this at age 62 when routine screening begins. A direct exam of the colon should be repeated every 5-10 years through  age 62, unless early forms of precancerous polyps or small growths are found.  People who are at an increased risk for hepatitis B should be screened for this virus. You are considered at high risk for hepatitis B if:  You were born in a country where hepatitis B occurs often. Talk with your health care provider about which countries are considered high risk.  Your parents were born in a high-risk country and you have not received a shot to protect against hepatitis B (hepatitis B vaccine).  You have HIV or AIDS.  You use needles to inject street drugs.  You live with, or have sex with, someone who has hepatitis B.  You are a man who has sex with other men (MSM).  You get hemodialysis treatment.  You take certain medicines for conditions like cancer, organ transplantation, and autoimmune conditions.  Hepatitis C blood testing is recommended for all people born from [id number] through 1966 and any  individual with known risk factors for hepatitis C.  Healthy men should no longer receive prostate-specific antigen (PSA) blood tests as part of routine cancer screening. Talk to your health care provider about prostate cancer screening.  Testicular cancer screening is not recommended for adolescents or adult males who have no symptoms. Screening includes self-exam, a health care provider exam, and other screening tests. Consult with your health care provider about any symptoms you have or any concerns you have about testicular cancer.  Practice safe sex. Use condoms and avoid high-risk sexual practices to reduce the spread of sexually transmitted infections (STIs).  You should be screened for STIs, including gonorrhea and chlamydia if:  You are sexually active and are younger than 24 years.  You are older than 24 years, and your health care provider tells you that you are at risk for this type of infection.  Your sexual activity has changed since you were last screened, and you are at an  increased risk for chlamydia or gonorrhea. Ask your health care provider if you are at risk.  If you are at risk of being infected with HIV, it is recommended that you take a prescription medicine daily to prevent HIV infection. This is called pre-exposure prophylaxis (PrEP). You are considered at risk if:  You are a man who has sex with other men (MSM).  You are a heterosexual man who is sexually active with multiple partners.  You take drugs by injection.  You are sexually active with a partner who has HIV.  Talk with your health care provider about whether you are at high risk of being infected with HIV. If you choose to begin PrEP, you should first be tested for HIV. You should then be tested every 3 months for as long as you are taking PrEP.  Use sunscreen. Apply sunscreen liberally and repeatedly throughout the day. You should seek shade when your shadow is shorter than you. Protect yourself by wearing long sleeves, pants, a wide-brimmed hat, and sunglasses year round whenever you are outdoors.  Tell your health care provider of new moles or changes in moles, especially if there is a change in shape or color. Also, tell your health care provider if a mole is larger than the size of a pencil eraser.  A one-time screening for abdominal aortic aneurysm (AAA) and surgical repair of large AAAs by ultrasound is recommended for men aged 65-75 years who are current or former smokers.  Stay current with your vaccines (immunizations). This information is not intended to replace advice given to you by your health care provider. Make sure you discuss any questions you have with your health care provider. Document Released: 11/02/2007 Document Revised: 05/27/2014 Document Reviewed: 02/07/2015 Elsevier Interactive Patient Education  2017 [employer].

## 2016-06-10 NOTE — Assessment & Plan Note (Addendum)
Better with sertraline, but still with some anxiety  Will try increasing sertaline to 100 mg daily

## 2016-06-10 NOTE — Addendum Note (Signed)
Addended by: Zenovia JordanMITCHELL, Lyndsie Wallman B on: 06/10/2016 02:22 PM   Modules accepted: Orders

## 2016-06-11 ENCOUNTER — Telehealth: Payer: Self-pay | Admitting: Emergency Medicine

## 2016-06-11 LAB — PSA, TOTAL AND FREE
PSA, % Free: 19 % — ABNORMAL LOW (ref >25)
PSA, FREE: 0.5 ng/mL
PSA, Total: 2.7 ng/mL (ref <=4.0)

## 2016-06-11 LAB — HEPATITIS C ANTIBODY: HCV Ab: NEGATIVE

## 2016-06-11 NOTE — Telephone Encounter (Signed)
[facility] states that pt has not been answering calls from palliative care, states that he does not need care from them.  Kathie RhodesBetty is requesting that we d/c palliative care at this time.  RB ok to d/c palliative care? Thanks

## 2016-06-12 NOTE — Telephone Encounter (Signed)
OK with me to cancel palliative care for now. I can revisit with him at OV and as his symptoms change.

## 2016-06-12 NOTE — Telephone Encounter (Signed)
[facility], aware of recs.  Nothing further needed at this time.

## 2016-06-14 ENCOUNTER — Encounter: Payer: Self-pay | Admitting: Internal Medicine

## 2016-08-13 ENCOUNTER — Telehealth: Payer: Self-pay

## 2016-08-13 MED ORDER — FLUTICASONE-SALMETEROL 250-50 MCG/DOSE IN AEPB: 1.0000 | INHALATION_SPRAY | Freq: Two times a day (BID) | RESPIRATORY_TRACT | 0 refills | Status: DC

## 2016-08-13 NOTE — Telephone Encounter (Signed)
Refill for Advair sent in.

## 2016-08-15 ENCOUNTER — Ambulatory Visit: Payer: BLUE CROSS/BLUE SHIELD | Admitting: Emergency Medicine

## 2016-08-19 ENCOUNTER — Telehealth: Payer: Self-pay | Admitting: Emergency Medicine

## 2016-08-19 MED ORDER — FLUTICASONE-SALMETEROL 250-50 MCG/DOSE IN AEPB: 1.0000 | INHALATION_SPRAY | Freq: Two times a day (BID) | RESPIRATORY_TRACT | 3 refills | Status: AC

## 2016-08-19 NOTE — Telephone Encounter (Signed)
Pt receives rx's through [facility] order and not [facility]- rx escribed to correct pharmacy.  Nothing further needed.

## 2016-08-19 NOTE — Telephone Encounter (Signed)
lmtcb for [name]  

## 2016-08-20 NOTE — Telephone Encounter (Signed)
Spoke with the [place] with Palliative care  She states that the pt is needing a new referral  He was d/c b/c he did not feel services were needed, but has since called and requested visit  Please advise if ok to order

## 2016-08-22 NOTE — Telephone Encounter (Signed)
Yes this is Ok to order

## 2016-08-22 NOTE — Telephone Encounter (Signed)
Spoke with Kathie Rhodes and gave VO  Nothing further needed

## 2016-09-11 ENCOUNTER — Ambulatory Visit: Payer: BLUE CROSS/BLUE SHIELD | Admitting: Emergency Medicine

## 2016-09-20 ENCOUNTER — Other Ambulatory Visit: Payer: Self-pay | Admitting: Internal Medicine

## 2016-09-24 ENCOUNTER — Other Ambulatory Visit: Payer: Self-pay

## 2016-09-24 MED ORDER — TIOTROPIUM BROMIDE MONOHYDRATE 18 MCG IN CAPS: ORAL_CAPSULE | RESPIRATORY_TRACT | 0 refills | Status: DC

## 2016-09-24 NOTE — Telephone Encounter (Signed)
[facility] requesting refill for 90 days of spiriva. Rx sent.

## 2016-09-25 ENCOUNTER — Telehealth: Payer: Self-pay | Admitting: Emergency Medicine

## 2016-09-25 MED ORDER — TIOTROPIUM BROMIDE MONOHYDRATE 18 MCG IN CAPS: ORAL_CAPSULE | RESPIRATORY_TRACT | 1 refills | Status: AC

## 2016-09-25 NOTE — Telephone Encounter (Signed)
Pt states rx was sent to incorrect pharmacy- needs 90 day supply rx of spiriva to be sent to [facility].  rx has been sent to corrected pharmacy.  Nothing further needed.

## 2016-10-01 ENCOUNTER — Encounter: Payer: Self-pay | Admitting: Emergency Medicine

## 2016-10-01 ENCOUNTER — Ambulatory Visit (INDEPENDENT_AMBULATORY_CARE_PROVIDER_SITE_OTHER): Payer: BLUE CROSS/BLUE SHIELD | Admitting: Emergency Medicine

## 2016-10-01 VITALS — BP 126/88 | HR 133 | Ht 63.0 in | Wt 114.2 lb

## 2016-10-01 DIAGNOSIS — J9611 Chronic respiratory failure with hypoxia: Secondary | ICD-10-CM | POA: Diagnosis not present

## 2016-10-01 DIAGNOSIS — J449 Chronic obstructive pulmonary disease, unspecified: Secondary | ICD-10-CM | POA: Diagnosis not present

## 2016-10-01 NOTE — Patient Instructions (Addendum)
Please continue your Spiriva and Advair as you are taking them  We will consider changing to an alternative called Trelegy in the future. Use DuoNeb as needed Continue your loratadine daily.  Continue your oxygen at 4-5L/min Dr Delton CoombesByrum placed orders in the chart today to reflect your wishes for your continued care, especially in the event of respiratory failure.  Follow with Dr Delton CoombesByrum in 4 months or sooner if you have any problems.

## 2016-10-01 NOTE — Assessment & Plan Note (Signed)
Continue your oxygen at 4-5 L/min 

## 2016-10-01 NOTE — Assessment & Plan Note (Signed)
Please continue your Spiriva and Advair as you are taking them  We will consider changing to an alternative called Trelegy in the future. Use DuoNeb as needed Continue your loratadine daily.  Dr Delton CoombesByrum placed orders in the chart today to reflect your wishes for your continued care, especially in the event of respiratory failure.  Follow with Dr Delton CoombesByrum in 4 months or sooner if you have any problems.

## 2016-10-01 NOTE — Progress Notes (Signed)
  Subjective:    Patient ID: [name], male    DOB: [birth year], 61 y.o.   MRN: [id number]  HPI ROV 10/01/16 -- 61 year old man with very severe COPD and associated chronic hypoxemic respiratory failure. FEV1 0.54 L (18% predicted). He has done pulmonary rehabilitation in the past. Current oxygen needs are 4-5 L/m. He reports that he has been doing fairly well. His functional capacity is quite limited, able to walk through the house. ADL's are difficult, showering, etc. He has had increased secretions since Spring allergy season - more chest congestion, having some cough. No overt flares since I've seen him. Currently on Spiriva and Advair, uses DuoNeb prn, about 3x a day. Remains on loratadine. He has had contact with Palliative Care, doesn't really qualify for hospice at this time - they say it is because he can still drive a car. He has completed his Living will > his brother is his [name], [name]. He is willing to do BiPAP but would not want CPR, MV.       Objective:   Physical Exam Vitals:   10/01/16 1419  BP: 126/88  Pulse: (!) 133  SpO2: 90%  Weight: 114 lb 3.2 oz (51.8 kg)  Height: 5\' 3"  (1.6 m)    Gen: Pleasant, thin male , in no distress,  normal affect  ENT: No lesions,  mouth clear,  oropharynx clear, no postnasal drip  Neck: No JVD, no TMG, no carotid bruits  Lungs: no distress today, very distant but clear, no wheeze  Cardiovascular: RRR, heart sounds normal, no murmur or gallops, no peripheral edema  Musculoskeletal: No deformities, no cyanosis or clubbing  Neuro: alert, non focal  Skin: Warm, no lesions or rashes     Assessment & Plan:   COPD (chronic obstructive pulmonary disease) Please continue your Spiriva and Advair as you are taking them  We will consider changing to an alternative called Trelegy in the future. Use DuoNeb as needed Continue your loratadine daily.  Dr Delton CoombesByrum placed orders in the chart today to reflect your wishes for your continued  care, especially in the event of respiratory failure.  Follow with Dr Delton CoombesByrum in 4 months or sooner if you have any problems.   Chronic respiratory failure (HCC) Continue your oxygen at 4-5L/min  [name], MD, PhD 10/01/2016, 2:41 PM  Pulmonary and Critical Care [phone] or if no answer [phone]

## 2016-10-18 ENCOUNTER — Telehealth: Payer: Self-pay | Admitting: Emergency Medicine

## 2016-10-18 MED ORDER — ONDANSETRON 4 MG PO TBDP: 4.0000 mg | ORAL_TABLET | Freq: Three times a day (TID) | ORAL | 0 refills | Status: AC | PRN

## 2016-10-18 NOTE — Telephone Encounter (Signed)
I agree with the anti-emetics. Please ask him to make sure he is still able to take his inhaled meds. Thanks.

## 2016-10-18 NOTE — Telephone Encounter (Signed)
Pt c/o decreased appetite, fatigue, vomiting X4 times this morning.    Denies fever, chest pain, any breathing distress.  Denies any new med starts.    Pt has only been able to keep down water and ginger ale.    I advised pt to call PCP- states he will, wants our recs as well.  RB please advise if anything is recommended from a pulmonary standpoint.  Thanks.

## 2016-10-18 NOTE — Telephone Encounter (Signed)
Spoke with pt, states he is still able to take inhaled meds. Aware of RB's recs.  Nothing further needed.

## 2016-10-18 NOTE — Telephone Encounter (Signed)
Patient called to make sure that Dr. Lawerance BachBurns was aware of this as well.

## 2016-10-18 NOTE — Telephone Encounter (Signed)
Possible GI bug.  Can be seen tomorrow in our office if no improvement.   Keep up with water intake to avoid dehydartion.  I can send in an anti-nausea medication.  Once able to eat - small amount - bland diet.

## 2016-10-18 NOTE — Telephone Encounter (Signed)
[facility]

## 2016-10-29 ENCOUNTER — Telehealth: Payer: Self-pay | Admitting: Internal Medicine

## 2016-10-29 DIAGNOSIS — J418 Mixed simple and mucopurulent chronic bronchitis: Secondary | ICD-10-CM

## 2016-10-29 DIAGNOSIS — J9611 Chronic respiratory failure with hypoxia: Secondary | ICD-10-CM

## 2016-10-29 NOTE — Telephone Encounter (Signed)
Referral to hospice ordered - please call.

## 2016-10-29 NOTE — Telephone Encounter (Signed)
NP did an evaluation today and believe Pt is a candidate for hospice care, would like and orders for hospice and Dr. Lawerance BachBurns to be the attending and if you are ok with standing orders.

## 2016-10-30 NOTE — Telephone Encounter (Signed)
LVM with Kathie RhodesBetty informing that orders had been placed for Hospice Care. Will Follow-up later to verify she got the message.

## 2016-11-01 NOTE — Telephone Encounter (Signed)
Received fax stating pt has been admitted to hospice services.

## 2016-12-10 ENCOUNTER — Ambulatory Visit: Payer: BLUE CROSS/BLUE SHIELD | Admitting: Internal Medicine

## 2016-12-16 ENCOUNTER — Ambulatory Visit: Payer: BLUE CROSS/BLUE SHIELD | Admitting: Internal Medicine

## 2017-02-10 ENCOUNTER — Ambulatory Visit: Payer: BLUE CROSS/BLUE SHIELD | Admitting: Emergency Medicine

## 2017-02-25 ENCOUNTER — Ambulatory Visit: Payer: BLUE CROSS/BLUE SHIELD | Admitting: Internal Medicine

## 2017-03-21 ENCOUNTER — Telehealth: Payer: Self-pay | Admitting: Internal Medicine

## 2017-03-21 NOTE — Telephone Encounter (Signed)
Patient is under hospice care.  He is requesting samples of spiriva and advair.

## 2017-03-21 NOTE — Telephone Encounter (Signed)
We have 1.7025mcg and 2.5 mcg, can pt use either of these? I do not think we have advair.

## 2017-03-21 NOTE — Telephone Encounter (Signed)
Spoke with pt to inform, he will have someone pick the Spiriva inhalers up from our office as Hospice only covers his Duoneb inhaler.

## 2017-03-21 NOTE — Telephone Encounter (Signed)
You can tell him which Spiriva we have and if he wants them he can use them.  He can also try pulmonary upstairs.

## 2017-03-24 IMAGING — CR DG CHEST 2V
2 series · 2 of 2 positions shown · non-contrast
Comparison: None.

CLINICAL DATA: Worsening shortness of breath. Cough and congestion.

EXAM:
CHEST  2 VIEW

[view not recorded (1 of 2)]
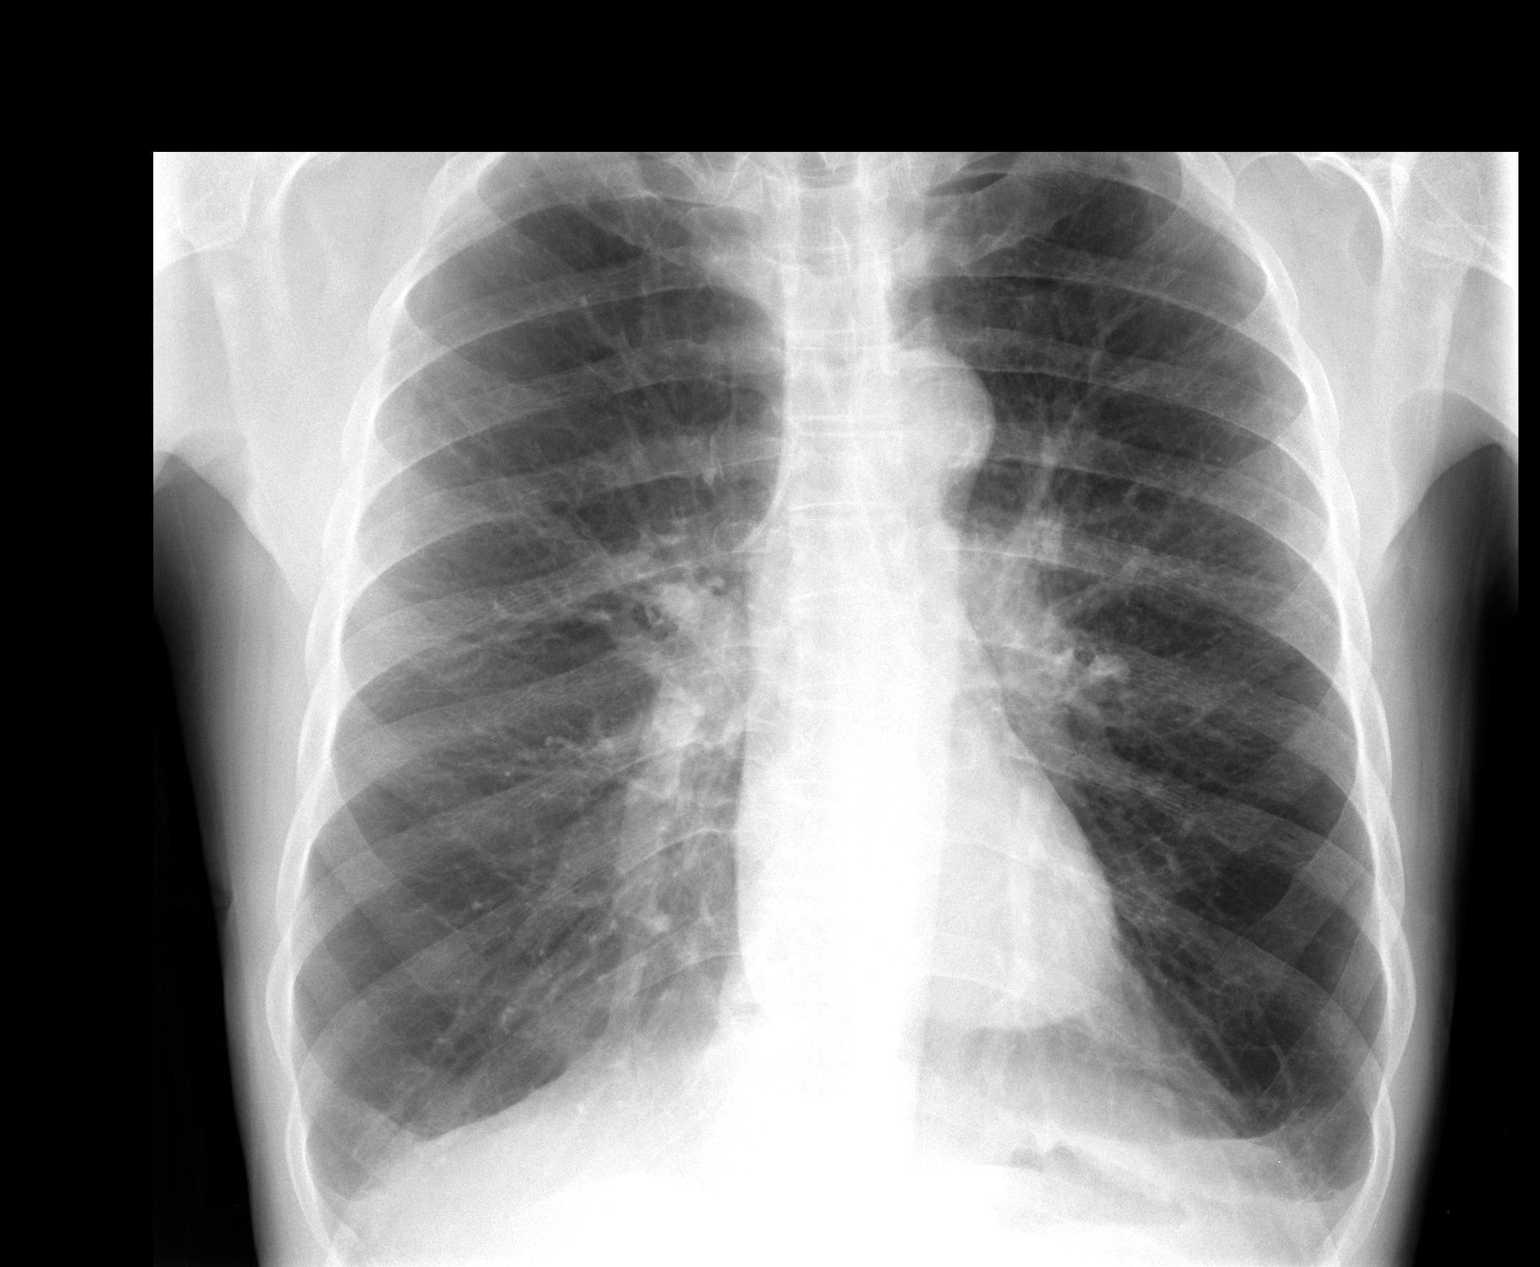

[view not recorded (2 of 2)]
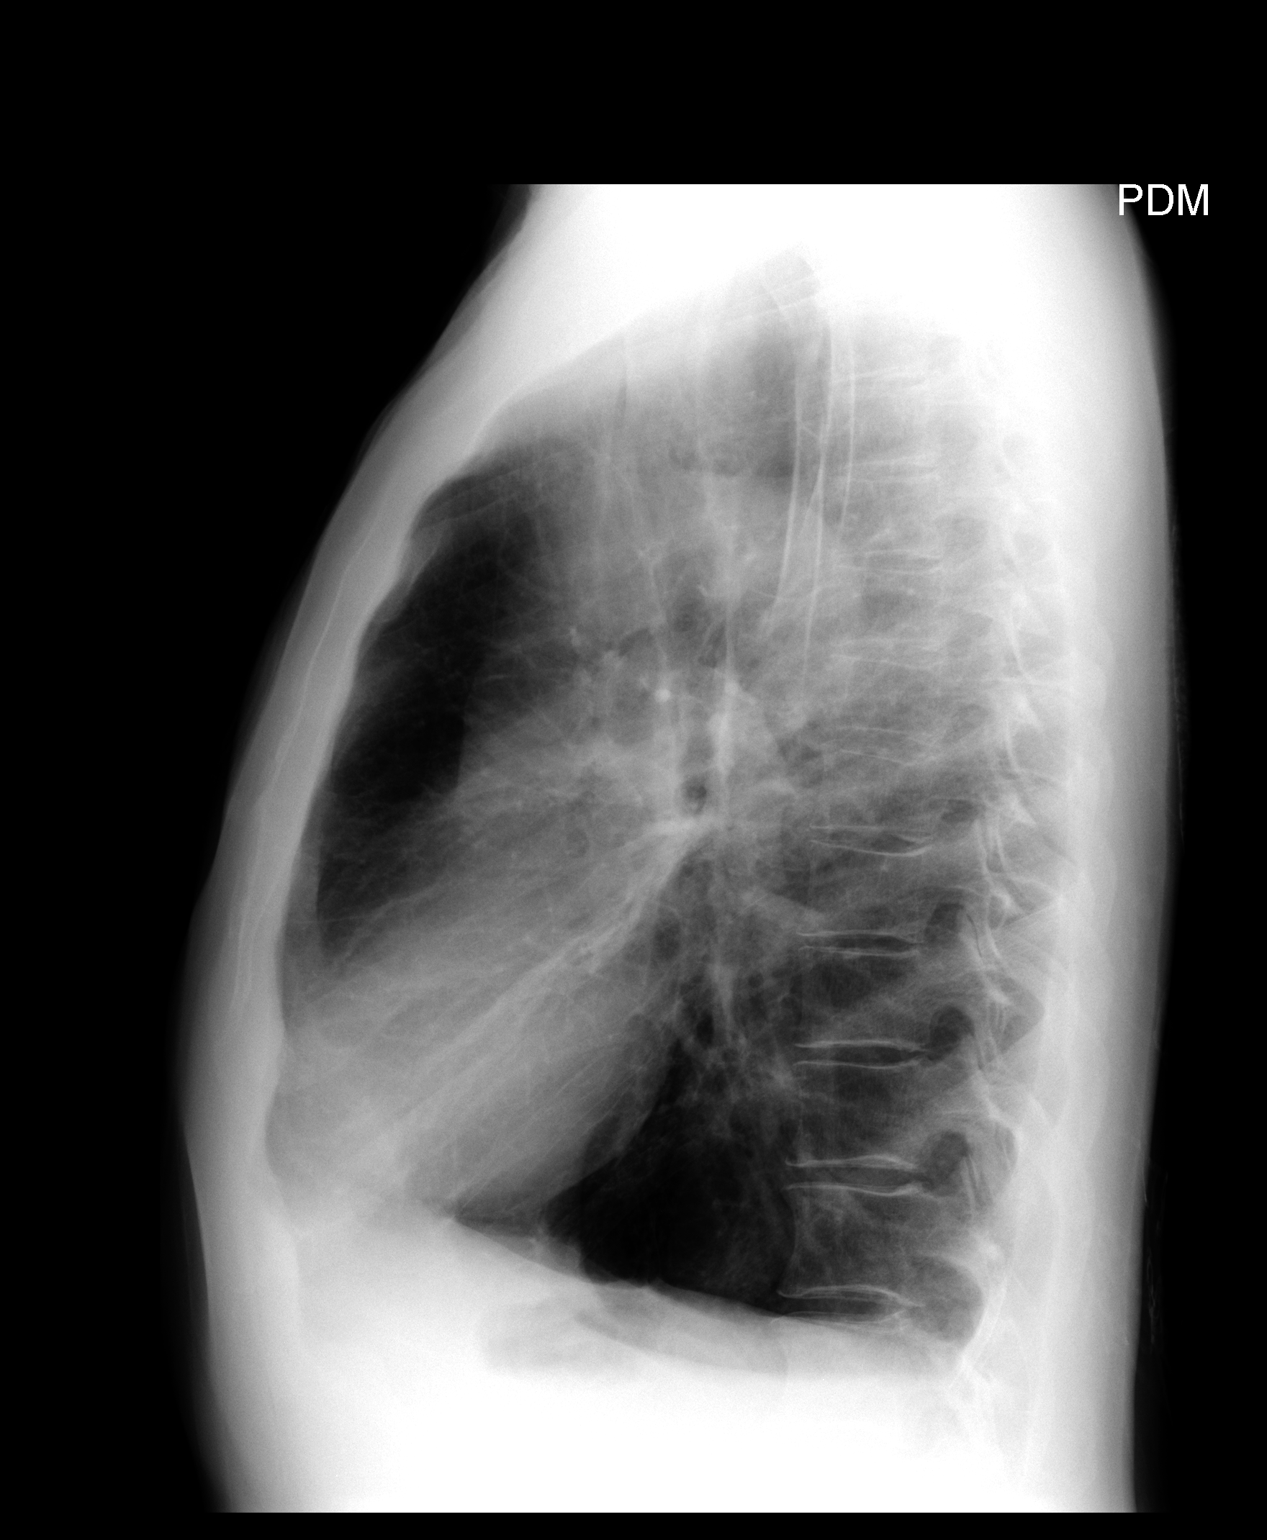

[2 of 2 positions shown; findings below may reference images not displayed]

FINDINGS: Mediastinum and hilar structures normal. Heart size normal. Lungs
are clear of acute infiltrates. COPD cannot be excluded. Basilar
pleural parenchymal thickening noted consistent with scarring.
IMPRESSION: 1. Basal pleural parenchymal thickening consistent with scarring.
COPD. No acute infiltrates.
2. No acute abnormality.  Exam is stable from prior exam .

## 2017-03-26 ENCOUNTER — Ambulatory Visit: Payer: BLUE CROSS/BLUE SHIELD | Admitting: Internal Medicine

## 2017-06-09 ENCOUNTER — Telehealth: Payer: Self-pay | Admitting: Internal Medicine
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
## 2017-06-09 NOTE — Telephone Encounter (Signed)
Noted  

## 2017-06-12 ENCOUNTER — Telehealth: Payer: Self-pay

## 2017-06-20 NOTE — Telephone Encounter (Signed)
On 03-21-18 I [facility] The d/c is for cremation. The patient is a patient of Doctor Burns. The d/c will be taken to [facility] for signature.  On 06/13/17 I received the d/c back from Doctor Burns. I got the d/c ready and faxed the d/c to the funeral home per the funeral home request.

## 2017-06-20 NOTE — Telephone Encounter (Signed)
On 10-04-2017 I [facility] a [facility] ([facility] The d/c is for cremation. The patient is a patient of Doctor Burns. The d/c will be taken to [facility] for signature. On 06/16/17 I received the d/c back from Doctor Cheryll CockayneStacy Burns. I got the d/c ready and called the funeral home to let them know the d/c is ready for pickup.

## 2017-06-20 DEATH — deceased

## 2017-12-04 IMAGING — CR DG CHEST 1V PORT
2 series · 2 of 2 positions shown · non-contrast
Comparison: One-view chest x-ray 05/24/2015.

CLINICAL DATA: Acute chronic respiratory failure.  COPD.

EXAM:
PORTABLE CHEST - 1 VIEW

[AP (1 of 2)]
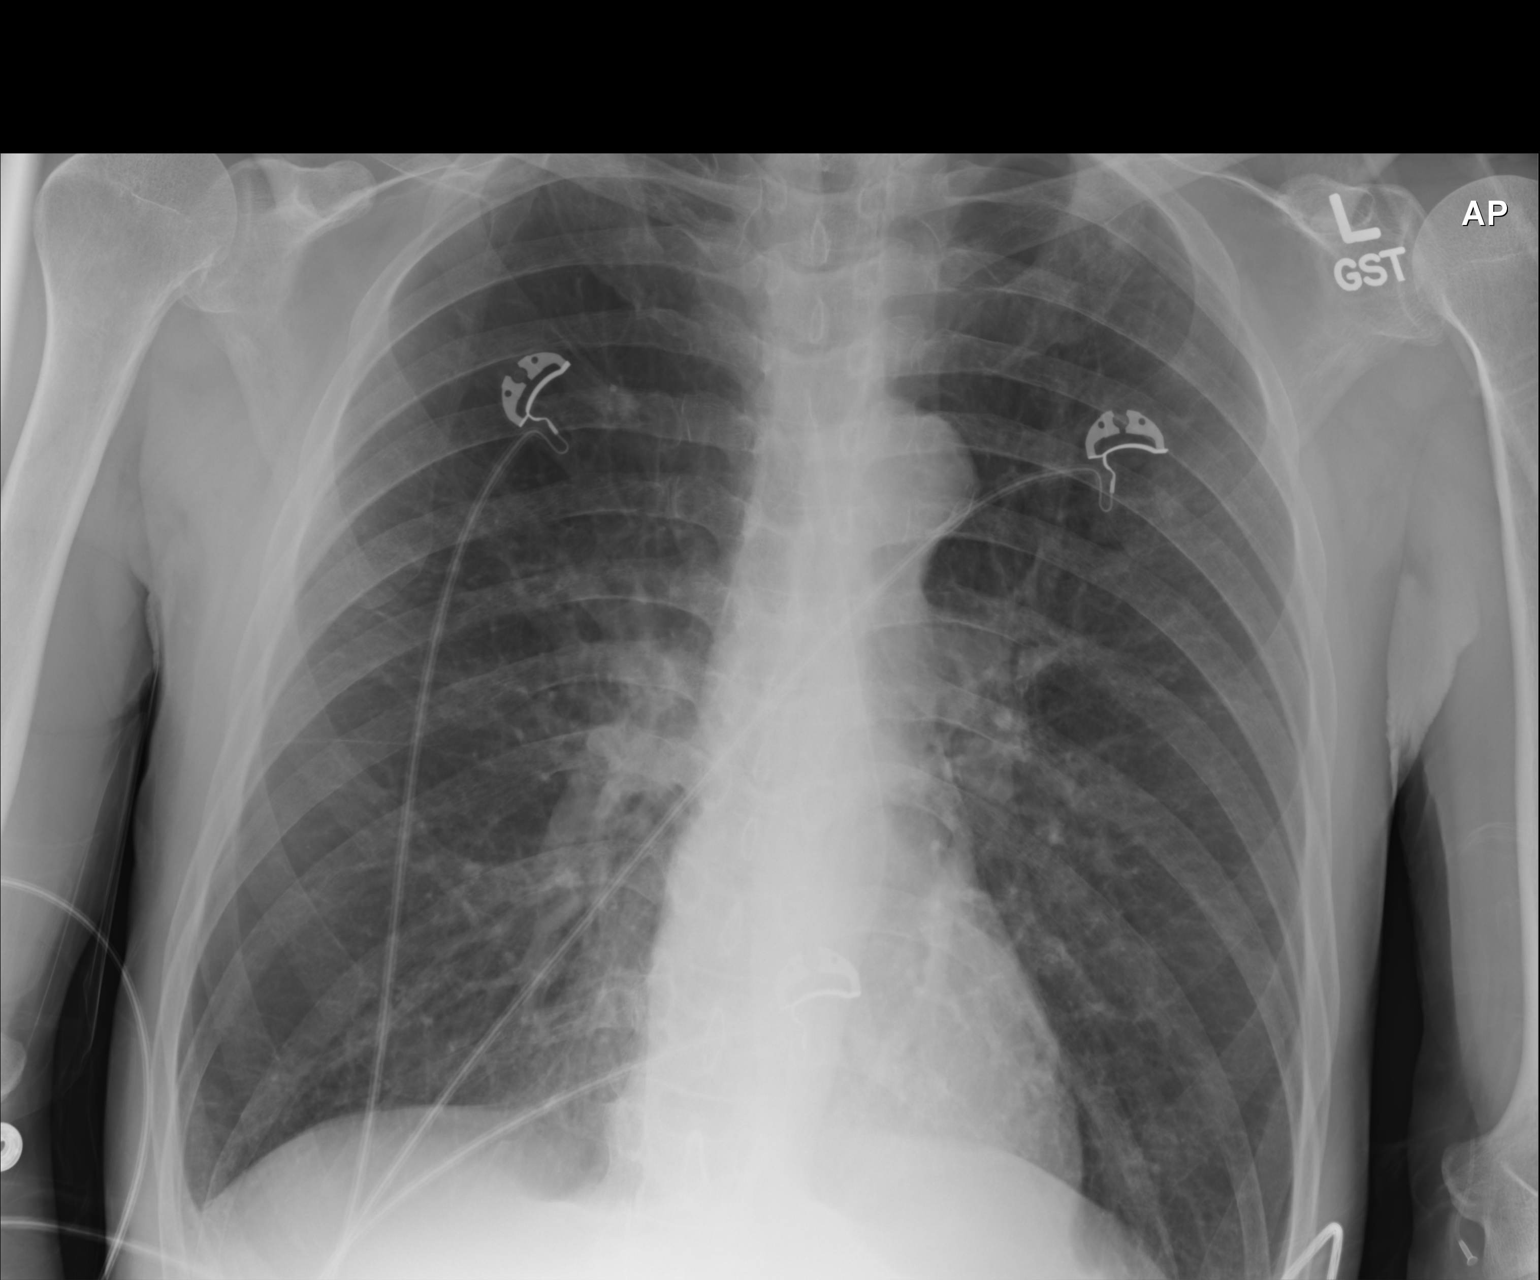

[AP (2 of 2)]
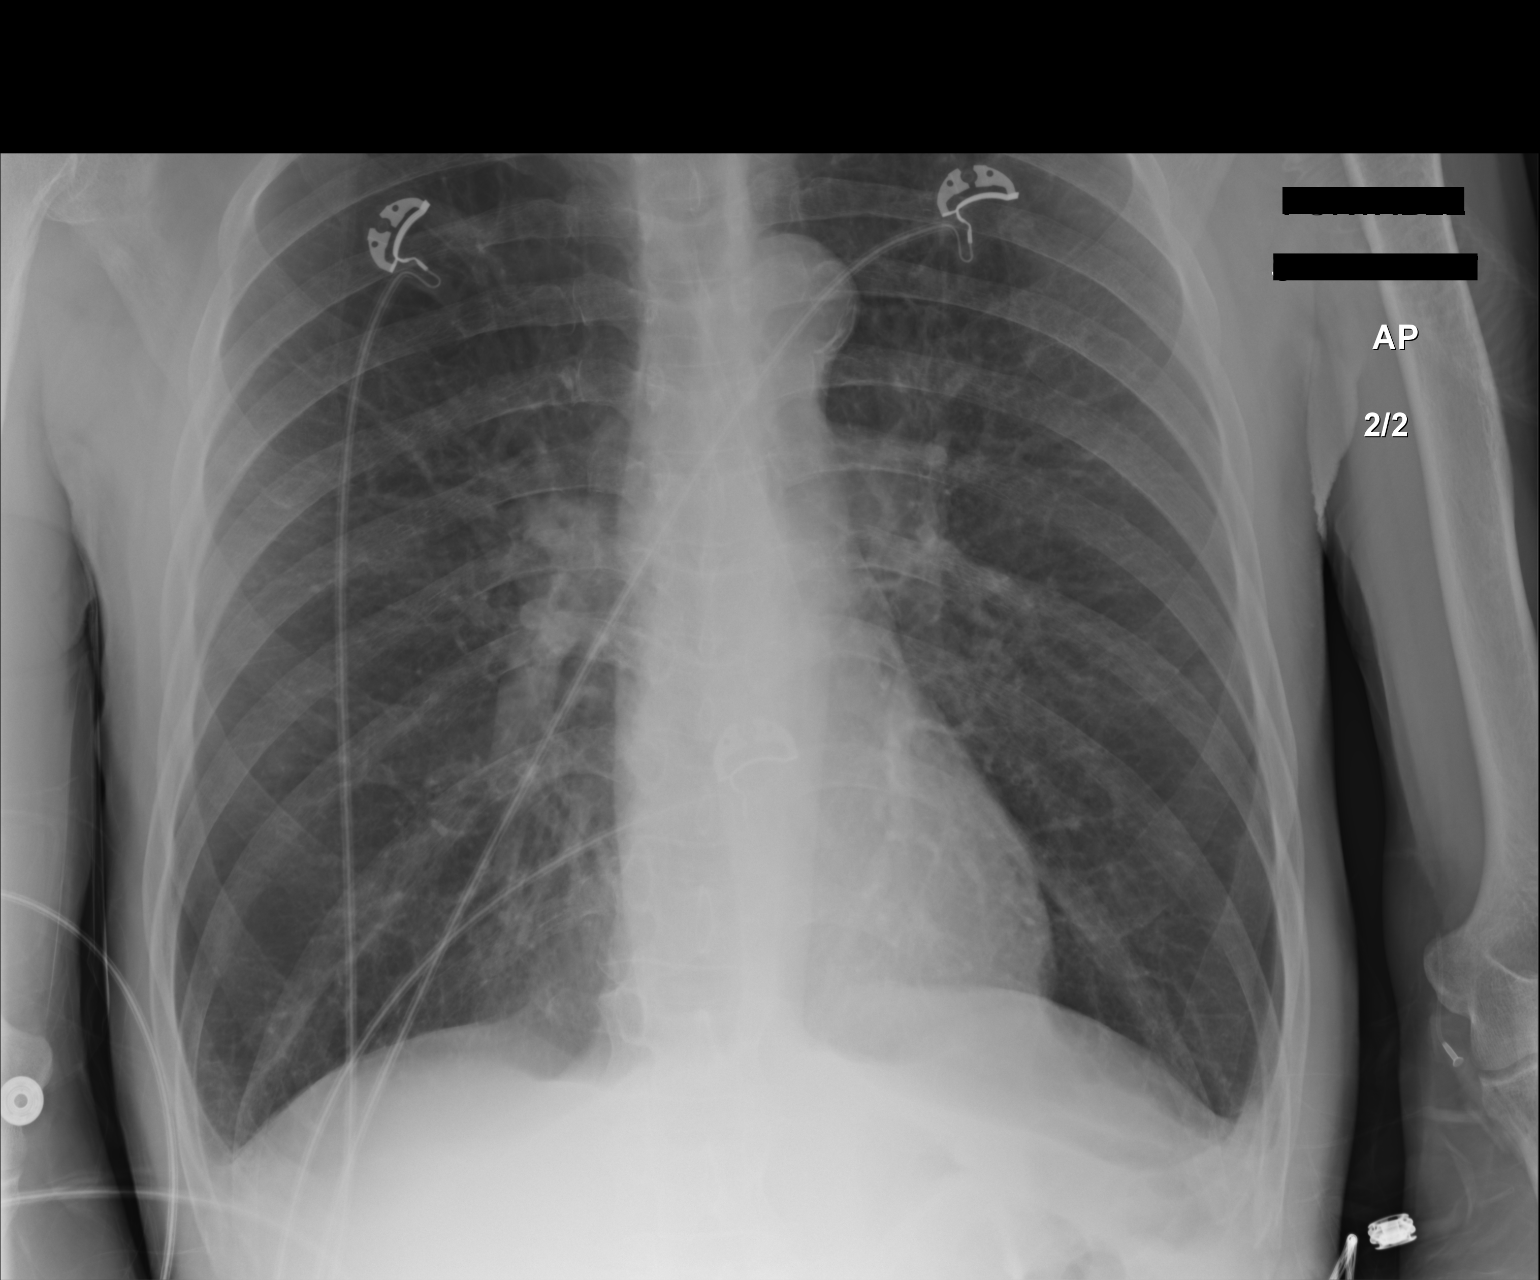

[2 of 2 positions shown; findings below may reference images not displayed]

FINDINGS: The heart size is normal. Mild pulmonary vascular congestion has
increased. Emphysematous changes are again noted. There no
significant effusions. Atherosclerotic changes are again noted at
the aortic arch.
IMPRESSION: 1. Slight increase and mild pulmonary vascular congestion without
significant effusions.
2. Emphysema.
3. Atherosclerosis in the thoracic aorta.
